# Patient Record
Sex: Male | Born: 1945 | Race: White | Hispanic: No | Marital: Married | Smoking: Former smoker
Health system: Southern US, Community
[De-identification: ages and names within clinical notes are randomized; demographics above are authoritative.]

## PROBLEM LIST (undated history)

## (undated) DIAGNOSIS — I10 Essential (primary) hypertension: Secondary | ICD-10-CM

## (undated) DIAGNOSIS — A809 Acute poliomyelitis, unspecified: Secondary | ICD-10-CM

## (undated) HISTORY — DX: Essential (primary) hypertension: I10

## (undated) HISTORY — DX: Acute poliomyelitis, unspecified: A80.9

## (undated) HISTORY — PX: FEMUR SURGERY: SHX943

## (undated) HISTORY — PX: BONY PELVIS SURGERY: SHX572

## (undated) HISTORY — PX: KNEE SURGERY: SHX244

---

## 2007-10-11 ENCOUNTER — Encounter: Admission: RE | Admit: 2007-10-11 | Discharge: 2007-10-11 | Payer: Self-pay | Admitting: Orthopedic Surgery

## 2009-07-08 ENCOUNTER — Ambulatory Visit: Payer: Self-pay | Admitting: Gastroenterology

## 2009-07-22 ENCOUNTER — Ambulatory Visit: Payer: Self-pay | Admitting: Gastroenterology

## 2010-10-04 ENCOUNTER — Encounter: Payer: Self-pay | Admitting: Orthopedic Surgery

## 2014-05-21 ENCOUNTER — Other Ambulatory Visit: Payer: Self-pay | Admitting: *Deleted

## 2014-05-21 ENCOUNTER — Ambulatory Visit (INDEPENDENT_AMBULATORY_CARE_PROVIDER_SITE_OTHER): Payer: Medicare PPO | Admitting: Podiatry

## 2014-05-21 ENCOUNTER — Ambulatory Visit (INDEPENDENT_AMBULATORY_CARE_PROVIDER_SITE_OTHER): Payer: Medicare PPO

## 2014-05-21 ENCOUNTER — Encounter: Payer: Self-pay | Admitting: Podiatry

## 2014-05-21 VITALS — Ht 67.0 in | Wt 170.0 lb

## 2014-05-21 DIAGNOSIS — M2041 Other hammer toe(s) (acquired), right foot: Secondary | ICD-10-CM

## 2014-05-21 DIAGNOSIS — L608 Other nail disorders: Secondary | ICD-10-CM

## 2014-05-21 DIAGNOSIS — M2040 Other hammer toe(s) (acquired), unspecified foot: Secondary | ICD-10-CM

## 2014-05-21 DIAGNOSIS — B351 Tinea unguium: Secondary | ICD-10-CM

## 2014-05-21 DIAGNOSIS — Q828 Other specified congenital malformations of skin: Secondary | ICD-10-CM

## 2014-05-21 DIAGNOSIS — M21611 Bunion of right foot: Secondary | ICD-10-CM

## 2014-05-21 DIAGNOSIS — M21619 Bunion of unspecified foot: Secondary | ICD-10-CM

## 2014-05-21 NOTE — Progress Notes (Signed)
   Subjective:    Patient ID: Edward Frazier, male    DOB: 05-18-1946, 68 y.o.   MRN: [id number]  HPI Comments: The right foot the middle toe seem to get sore and than it goes away, it has been numerous years of this going on . It is to the point i need to do something. Been walking on the tips of my toes since mid twenties early 30's . 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   Foot Pain      Review of Systems  All other systems reviewed and are negative.      Objective:   Physical Exam: I have reviewed his past medical history medications allergies surgeries social history and review of systems. Pulses are strongly palpable bilateral. Neurologic sensorium is not intact to the right foot secondary to history of polio. Deep tendon reflexes are hyperreflexive. Orthopedic evaluation demonstrates limited range of motion and muscle strength right. Plantar flexed and coronal hammertoe deformities #3 #4 #5 of the right foot resulting in a distal clavus to the third digit of the right foot. Radiographs do not demonstrate any type of osseous abnormalities in this area other than a hammertoe deformities. He does have a red erythematous rash the plantar aspect of the foot appears to be a dyshidrotic eczema or possibly tinea pedis he also has thick yellow dystrophic possibly mycotic nails to the hallux and second digit of the right foot. Distal clavus third digit right foot.        Assessment & Plan:  Assessment: Polio patient plantarflex hammertoe deformity is resulting in distal clavus third digit right foot. Dystrophic nails and possible tinea pedis I lateral.  Plan: Discussed etiology pathology conservative versus surgical therapies. He plans on having foot surgery performed by an orthopedist going to take a look the skin. I took a sample of the nails today and debridement reactive hyperkeratosis I will followup with him with his biopsy  returns.

## 2014-06-04 ENCOUNTER — Telehealth: Payer: Self-pay | Admitting: *Deleted

## 2014-06-04 MED ORDER — NUVAIL EX SOLN: 1.0000 "application " | Freq: Every day | CUTANEOUS | Status: DC

## 2014-06-04 NOTE — Telephone Encounter (Signed)
I called and informed the patient that Dr. Al Corpus said the culture was negative for fungus.  It's just a deformity of the nail, he said he can prescribe Nuvail to help thin the nail.  He asked how he could proceed.  I told him I would send the prescription to his pharmacy.  He asked that I send it to [facility].

## 2014-06-07 ENCOUNTER — Encounter: Payer: Self-pay | Admitting: Podiatry

## 2014-08-26 DIAGNOSIS — M21969 Unspecified acquired deformity of unspecified lower leg: Secondary | ICD-10-CM | POA: Insufficient documentation

## 2015-04-02 DIAGNOSIS — C44629 Squamous cell carcinoma of skin of left upper limb, including shoulder: Secondary | ICD-10-CM | POA: Diagnosis not present

## 2015-04-02 DIAGNOSIS — Z85828 Personal history of other malignant neoplasm of skin: Secondary | ICD-10-CM | POA: Diagnosis not present

## 2015-04-02 DIAGNOSIS — D485 Neoplasm of uncertain behavior of skin: Secondary | ICD-10-CM | POA: Diagnosis not present

## 2015-04-02 DIAGNOSIS — L570 Actinic keratosis: Secondary | ICD-10-CM | POA: Diagnosis not present

## 2015-04-02 DIAGNOSIS — C44529 Squamous cell carcinoma of skin of other part of trunk: Secondary | ICD-10-CM | POA: Diagnosis not present

## 2015-04-08 ENCOUNTER — Encounter: Payer: Self-pay | Admitting: Gastroenterology

## 2016-10-01 DIAGNOSIS — L719 Rosacea, unspecified: Secondary | ICD-10-CM | POA: Insufficient documentation

## 2017-10-06 DIAGNOSIS — G8190 Hemiplegia, unspecified affecting unspecified side: Secondary | ICD-10-CM | POA: Insufficient documentation

## 2018-03-30 ENCOUNTER — Encounter: Payer: Self-pay | Admitting: Podiatry

## 2018-03-30 ENCOUNTER — Ambulatory Visit: Payer: Medicare Other

## 2018-03-30 ENCOUNTER — Ambulatory Visit (INDEPENDENT_AMBULATORY_CARE_PROVIDER_SITE_OTHER): Payer: Medicare Other | Admitting: Podiatry

## 2018-03-30 VITALS — BP 132/72 | HR 79

## 2018-03-30 DIAGNOSIS — B351 Tinea unguium: Secondary | ICD-10-CM

## 2018-03-30 DIAGNOSIS — M2042 Other hammer toe(s) (acquired), left foot: Principal | ICD-10-CM

## 2018-03-30 DIAGNOSIS — G5761 Lesion of plantar nerve, right lower limb: Secondary | ICD-10-CM | POA: Diagnosis not present

## 2018-03-30 DIAGNOSIS — M2041 Other hammer toe(s) (acquired), right foot: Secondary | ICD-10-CM

## 2018-03-30 DIAGNOSIS — M79676 Pain in unspecified toe(s): Secondary | ICD-10-CM | POA: Diagnosis not present

## 2018-03-30 DIAGNOSIS — G5791 Unspecified mononeuropathy of right lower limb: Secondary | ICD-10-CM

## 2018-04-01 NOTE — Progress Notes (Signed)
He presents today with a history of polio resulting in severe hammertoe deformities where he is walking on the dorsal aspect of his toes of the right foot.  They are curled underneath and he is walking on them and his toenails are painful he says.  He states that they are very very tender and cannot be cut without anesthesia.  Objective: Vital signs are stable he is alert and oriented x3 I have reviewed his past medical history medications allergy surgeries social history pulses remain palpable.  Not a lot of strength to the right foot and hammertoe deformities with severe contracture deformities of the lesser digits resulting in him walking on the dorsal aspect of the toes plantarly.  Nails are thick yellow dystrophic-like mycotic painful palpation in the whole forefoot is exquisitely painful.  Assessment: Pain in limb secondary to onychomycosis and neuritis.  Plan: Debridement of toenails bilaterally.  And also performed a nerve block in order to debride his nails on his right foot.

## 2018-07-24 DIAGNOSIS — M6701 Short Achilles tendon (acquired), right ankle: Secondary | ICD-10-CM | POA: Insufficient documentation

## 2018-07-24 DIAGNOSIS — M2040 Other hammer toe(s) (acquired), unspecified foot: Secondary | ICD-10-CM | POA: Insufficient documentation

## 2019-06-27 ENCOUNTER — Encounter: Payer: Self-pay | Admitting: Gastroenterology

## 2019-10-03 ENCOUNTER — Ambulatory Visit: Payer: Medicare PPO | Attending: Internal Medicine

## 2019-10-03 DIAGNOSIS — Z23 Encounter for immunization: Secondary | ICD-10-CM | POA: Insufficient documentation

## 2019-10-03 NOTE — Progress Notes (Signed)
   Covid-19 Vaccination Clinic  Name:  Edward Frazier    MRN: [id number] DOB: 13-Mar-1946  10/03/2019  [name] was observed post Covid-19 immunization for 15 minutes without incidence. He was provided with Vaccine Information Sheet and instruction to access the V-Safe system.   [name] was instructed to call 911 with any severe reactions post vaccine: Marland Kitchen Difficulty breathing  . Swelling of your face and throat  . A fast heartbeat  . A bad rash all over your body  . Dizziness and weakness    Immunizations Administered    Name Date Dose VIS Date Route   Pfizer COVID-19 Vaccine 10/03/2019  4:28 PM 0.3 mL 08/24/2019 Intramuscular   Manufacturer: [employer], [employer]   Lot: [id number]   NDC: [address]-8

## 2019-10-24 ENCOUNTER — Ambulatory Visit: Payer: Medicare PPO | Attending: Internal Medicine

## 2019-10-24 DIAGNOSIS — Z23 Encounter for immunization: Secondary | ICD-10-CM | POA: Insufficient documentation

## 2019-10-24 NOTE — Progress Notes (Signed)
   Covid-19 Vaccination Clinic  Name:  Edward Frazier    MRN: [id number] DOB: May 21, 1946  10/24/2019  [name] was observed post Covid-19 immunization for 15 minutes without incidence. He was provided with Vaccine Information Sheet and instruction to access the V-Safe system.   [name] was instructed to call 911 with any severe reactions post vaccine: Marland Kitchen Difficulty breathing  . Swelling of your face and throat  . A fast heartbeat  . A bad rash all over your body  . Dizziness and weakness    Immunizations Administered    Name Date Dose VIS Date Route   Pfizer COVID-19 Vaccine 10/24/2019  9:27 AM 0.3 mL 08/24/2019 Intramuscular   Manufacturer: [employer], [employer]   Lot: [id number]   NDC: [address]-3

## 2019-11-02 ENCOUNTER — Other Ambulatory Visit: Payer: Self-pay

## 2019-11-02 ENCOUNTER — Ambulatory Visit: Payer: Medicare PPO | Admitting: Podiatry

## 2019-11-02 ENCOUNTER — Encounter: Payer: Self-pay | Admitting: *Deleted

## 2019-11-02 ENCOUNTER — Encounter: Payer: Self-pay | Admitting: Podiatry

## 2019-11-02 DIAGNOSIS — B351 Tinea unguium: Secondary | ICD-10-CM

## 2019-11-02 DIAGNOSIS — L84 Corns and callosities: Secondary | ICD-10-CM | POA: Diagnosis not present

## 2019-11-02 DIAGNOSIS — Q740 Other congenital malformations of upper limb(s), including shoulder girdle: Secondary | ICD-10-CM | POA: Insufficient documentation

## 2019-11-02 DIAGNOSIS — M79676 Pain in unspecified toe(s): Secondary | ICD-10-CM | POA: Diagnosis not present

## 2019-11-02 NOTE — Progress Notes (Signed)
This patient presents the office with chief complaint of painful third toe right foot.  He says that his toe is painful walking and wearing his shoes.  He says he was previously seen in 2019 by Dr. Al Corpus who provided him with anesthesia prior to nail treatment.  This patient has a history of severe contracted digital deformities on his right foot due to polio.  He presents the office today for an evaluation and treatment of his painful third toe right foot.  He also has  long thick painful nails which he says his wife occasionally trims.  Patient states he is not interested in toe surgery on his right foot but request preventative foot care services.   General Appearance  Alert, conversant and in no acute stress.  Vascular  Dorsalis pedis and posterior tibial  pulses are palpable  bilaterally.  Capillary return is within normal limits  bilaterally. Temperature is within normal limits  bilaterally.  Neurologic  Senn-Weinstein monofilament wire test within normal limits  bilaterally. Muscle power within normal limits left.  Muscle power diminished right foot.  Nails Thick disfigured discolored nails with subungual debris  from hallux to fifth toes bilaterally. No evidence of bacterial infection or drainage bilaterally.  Orthopedic  No limitations of motion  feet .  No crepitus or effusions noted.  Severe digital contractures especially his third toe right foot.  Skin  normotropic skin with no porokeratosis noted bilaterally.  No signs of infections or ulcers noted.  Digital clavi 3rd toe right foot.  Onychomycosis  Clavi 3rd toe right foot.  ROV.  Debride nails  B/L.  Debride clavi 3rd toe under local anesthesia third toe right foot.  RTC 3 months.  Helane Gunther DPM

## 2019-11-07 ENCOUNTER — Encounter: Payer: Self-pay | Admitting: Cardiology

## 2019-11-21 ENCOUNTER — Other Ambulatory Visit: Payer: Self-pay | Admitting: Internal Medicine

## 2019-11-21 DIAGNOSIS — I10 Essential (primary) hypertension: Secondary | ICD-10-CM

## 2019-12-03 ENCOUNTER — Ambulatory Visit
Admission: RE | Admit: 2019-12-03 | Discharge: 2019-12-03 | Disposition: A | Discharge status: Home / Self Care | Payer: Medicare PPO | Source: Ambulatory Visit | Attending: Internal Medicine | Admitting: Internal Medicine

## 2019-12-03 DIAGNOSIS — I10 Essential (primary) hypertension: Secondary | ICD-10-CM

## 2019-12-06 ENCOUNTER — Telehealth: Payer: Self-pay | Admitting: Cardiology

## 2019-12-06 NOTE — Telephone Encounter (Signed)
New Message  Patient is wanting to get his wife approved to accompany him to his appointment on 12/26/19 with Dr. Antoine Poche. Patient states that he needs his wife Edward Frazier) there with him to hear what Dr. Antoine Poche has to say. Please give patient a call back to confirm.

## 2019-12-06 NOTE — Telephone Encounter (Signed)
Informed pt that per Dr. Jenene Slicker nurse, wife ok to attend appointment.

## 2019-12-24 DIAGNOSIS — Z7189 Other specified counseling: Secondary | ICD-10-CM | POA: Insufficient documentation

## 2019-12-24 DIAGNOSIS — I2510 Atherosclerotic heart disease of native coronary artery without angina pectoris: Secondary | ICD-10-CM | POA: Insufficient documentation

## 2019-12-24 NOTE — Progress Notes (Signed)
Cardiology Office Note   Date:  12/26/2019   ID:  Edward Frazier, Edward Frazier 12/31/1945, MRN [id number]  PCP:  Martha Clan, MD  Cardiologist:   No primary care provider on file. Referring:  Martha Clan, MD  No chief complaint on file.     History of Present Illness: Edward Frazier is a 74 y.o. male who presents for evaluation of elevated coronary calcium.  He has no history of coronary disease but did have some mild dyslipidemia.  He was sent for a coronary calcium score.  This came back elevated with a count of 874 in the left main, LAD 305 in the left circumflex 79.  Total was 1258 which put him at the 83rd percentile for his age.  He walks with a limp because of previous polio but he is active.  He denies any significant limitations though he has not been walking recently particularly because of this finding.  He has not had any chest pressure, neck or arm discomfort.  He denies any shortness of breath, PND or orthopnea.  He had no weight gain or edema.  He has never had any prior cardiac testing.  Past Medical History:  Diagnosis Date  . Hypertension   . Polio     No past surgical history on file.   Current Outpatient Medications  Medication Sig Dispense Refill  . amLODipine (NORVASC) 5 MG tablet     . aspirin 81 MG tablet Take 81 mg by mouth daily.    Marland Kitchen doxycycline (VIBRAMYCIN) 100 MG capsule     . lisinopril (PRINIVIL,ZESTRIL) 20 MG tablet     . rosuvastatin (CRESTOR) 20 MG tablet      No current facility-administered medications for this visit.    Allergies:   Penicillin g and Penicillins    Social History:  The patient  reports that he has quit smoking. He has never used smokeless tobacco.   Family History:  The patient's family history includes Cancer in his mother; Heart attack in his father.    ROS:  Please see the history of present illness.   Otherwise, review of systems are positive for none.   All other systems are reviewed and negative.    PHYSICAL  EXAM: VS:  BP 128/66   Pulse 72   Temp (!) 97.1 F (36.2 C)   Ht 5\' 7"  (1.702 m)   Wt 171 lb 6.4 oz (77.7 kg)   SpO2 98%   BMI 26.85 kg/m  , BMI Body mass index is 26.85 kg/m. GENERAL:  Well appearing HEENT:  Pupils equal round and reactive, fundi not visualized, oral mucosa unremarkable NECK:  No jugular venous distention, waveform within normal limits, carotid upstroke brisk and symmetric, no bruits, no thyromegaly LYMPHATICS:  No cervical, inguinal adenopathy LUNGS:  Clear to auscultation bilaterally BACK:  No CVA tenderness CHEST:  Unremarkable HEART:  PMI not displaced or sustained,S1 and S2 within normal limits, no S3, no S4, no clicks, no rubs, no murmurs ABD:  Flat, positive bowel sounds normal in frequency in pitch, no bruits, no rebound, no guarding, no midline pulsatile mass, no hepatomegaly, no splenomegaly EXT:  2 plus pulses throughout, no edema, no cyanosis no clubbing SKIN:  No rashes no nodules NEURO:  Cranial nerves II through XII grossly intact, motor grossly intact throughout PSYCH:  Cognitively intact, oriented to person place and time    EKG:  EKG is ordered today. The ekg ordered today demonstrates sinus rhythm, rate 74, axis within normal  limits, intervals within normal limits, no acute ST-T wave changes.   Recent Labs: No results found for requested labs within last 8760 hours.    Lipid Panel No results found for: CHOL, TRIG, HDL, CHOLHDL, VLDL, LDLCALC, LDLDIRECT    Wt Readings from Last 3 Encounters:  12/26/19 171 lb 6.4 oz (77.7 kg)  05/21/14 170 lb (77.1 kg)      Other studies Reviewed: Additional studies/ records that were reviewed today include: Office records and labs from Marton Redwood, MD. Review of the above records demonstrates:  Please see elsewhere in the note.     ASSESSMENT AND PLAN:  CAD:   The patient does have coronary calcium.  He does not have symptoms of obstructive disease. I will bring the patient back for a POET  (Plain Old Exercise Test). This will allow me to screen for obstructive coronary disease, risk stratify and very importantly provide a prescription for exercise.    COVID EDUCATION: He has had his vaccine.  DYSLIPIDEMIA: I agree given his elevated calcium score that statin is appropriate and he has been started on this.  The goal will be an LDL less than 70.  His LDL was 107.  Current medicines are reviewed at length with the patient today.  The patient does not have concerns regarding medicines.  The following changes have been made:  no change  Labs/ tests ordered today include:   Orders Placed This Encounter  Procedures  . EXERCISE TOLERANCE TEST (ETT)  . EKG 12-Lead     Disposition:   FU with me in one year or sooner based on test results.     Signed, Minus Breeding, MD  12/26/2019 6:12 PM    [facility]

## 2019-12-26 ENCOUNTER — Other Ambulatory Visit: Payer: Self-pay

## 2019-12-26 ENCOUNTER — Ambulatory Visit: Payer: Medicare PPO | Admitting: Cardiology

## 2019-12-26 ENCOUNTER — Encounter: Payer: Self-pay | Admitting: Cardiology

## 2019-12-26 VITALS — BP 128/66 | HR 72 | Temp 97.1°F | Ht 67.0 in | Wt 171.4 lb

## 2019-12-26 DIAGNOSIS — Z7189 Other specified counseling: Secondary | ICD-10-CM

## 2019-12-26 DIAGNOSIS — I2510 Atherosclerotic heart disease of native coronary artery without angina pectoris: Secondary | ICD-10-CM | POA: Diagnosis not present

## 2019-12-26 DIAGNOSIS — R072 Precordial pain: Secondary | ICD-10-CM

## 2019-12-26 DIAGNOSIS — R002 Palpitations: Secondary | ICD-10-CM

## 2019-12-26 DIAGNOSIS — R0602 Shortness of breath: Secondary | ICD-10-CM | POA: Diagnosis not present

## 2019-12-26 NOTE — Patient Instructions (Signed)
Medication Instructions:  No changes *If you need a refill on your cardiac medications before your next appointment, please call your pharmacy*  Lab Work: None ordered this visit  Testing/Procedures:  You will need a covid screen 3 days before your stress test.This is a Drive Up Visit at the [facility] [address], [place]. Someone will direct you to the appropriate testing line. Stay in your car and someone will be with you shortly  Your physician has requested that you have an exercise tolerance test. For further information please visit [email or web address] Please also follow instruction sheet, as given.   Follow-Up: At [facility], you and your health needs are our priority.  As part of our continuing mission to provide you with exceptional heart care, we have created designated Provider Care Teams.  These Care Teams include your primary Cardiologist (physician) and Advanced Practice Providers (APPs -  Physician Assistants and Nurse Practitioners) who all work together to provide you with the care you need, when you need it.  We recommend signing up for the patient portal called "MyChart".  Sign up information is provided on this After Visit Summary.  MyChart is used to connect with patients for Virtual Visits (Telemedicine).  Patients are able to view lab/test results, encounter notes, upcoming appointments, etc.  Non-urgent messages can be sent to your provider as well.   To learn more about what you can do with MyChart, go to [email or web address].    Your next appointment:   12 month(s)  You will receive a reminder letter in the mail two months in advance. If you Jameek't receive a letter, please call our office to schedule the follow-up appointment.  The format for your next appointment:   In Person  Provider:   Rollene Rotunda, MD

## 2020-01-07 ENCOUNTER — Other Ambulatory Visit (HOSPITAL_COMMUNITY)
Admission: RE | Admit: 2020-01-07 | Discharge: 2020-01-07 | Disposition: A | Discharge status: Home / Self Care | Payer: Medicare PPO | Source: Ambulatory Visit | Attending: Cardiology | Admitting: Cardiology

## 2020-01-07 DIAGNOSIS — Z20822 Contact with and (suspected) exposure to covid-19: Secondary | ICD-10-CM | POA: Insufficient documentation

## 2020-01-07 DIAGNOSIS — Z01812 Encounter for preprocedural laboratory examination: Secondary | ICD-10-CM | POA: Diagnosis not present

## 2020-01-07 LAB — SARS CORONAVIRUS 2 (TAT 6-24 HRS): SARS Coronavirus 2: NEGATIVE

## 2020-01-08 ENCOUNTER — Telehealth (HOSPITAL_COMMUNITY): Payer: Self-pay

## 2020-01-08 NOTE — Telephone Encounter (Signed)
Encounter complete. 

## 2020-01-10 ENCOUNTER — Other Ambulatory Visit: Payer: Self-pay

## 2020-01-10 ENCOUNTER — Ambulatory Visit (HOSPITAL_COMMUNITY)
Admission: RE | Admit: 2020-01-10 | Discharge: 2020-01-10 | Disposition: A | Discharge status: Home / Self Care | Payer: Medicare PPO | Source: Ambulatory Visit | Attending: Cardiology | Admitting: Cardiology

## 2020-01-10 DIAGNOSIS — R072 Precordial pain: Secondary | ICD-10-CM | POA: Diagnosis not present

## 2020-01-10 DIAGNOSIS — R0602 Shortness of breath: Secondary | ICD-10-CM

## 2020-01-10 DIAGNOSIS — I2510 Atherosclerotic heart disease of native coronary artery without angina pectoris: Secondary | ICD-10-CM | POA: Diagnosis not present

## 2020-01-10 DIAGNOSIS — R002 Palpitations: Secondary | ICD-10-CM | POA: Diagnosis not present

## 2020-01-11 LAB — EXERCISE TOLERANCE TEST
Estimated workload: 5.8 METS
Exercise duration (min): 4 min
Exercise duration (sec): 0 s
MPHR: 146 {beats}/min
Peak HR: 142 {beats}/min
Percent HR: 97 %
Rest HR: 76 {beats}/min

## 2020-01-14 ENCOUNTER — Telehealth: Payer: Self-pay | Admitting: Cardiology

## 2020-01-14 NOTE — Telephone Encounter (Signed)
Results reviewed

## 2020-01-14 NOTE — Telephone Encounter (Signed)
Follow Up:    Returning your call, concerning his test results.

## 2020-04-16 DIAGNOSIS — L570 Actinic keratosis: Secondary | ICD-10-CM | POA: Diagnosis not present

## 2020-04-16 DIAGNOSIS — D692 Other nonthrombocytopenic purpura: Secondary | ICD-10-CM | POA: Diagnosis not present

## 2020-04-16 DIAGNOSIS — L821 Other seborrheic keratosis: Secondary | ICD-10-CM | POA: Diagnosis not present

## 2020-04-16 DIAGNOSIS — Z85828 Personal history of other malignant neoplasm of skin: Secondary | ICD-10-CM | POA: Diagnosis not present

## 2020-11-05 DIAGNOSIS — R7301 Impaired fasting glucose: Secondary | ICD-10-CM | POA: Diagnosis not present

## 2020-11-05 DIAGNOSIS — Z125 Encounter for screening for malignant neoplasm of prostate: Secondary | ICD-10-CM | POA: Diagnosis not present

## 2020-11-05 DIAGNOSIS — E785 Hyperlipidemia, unspecified: Secondary | ICD-10-CM | POA: Diagnosis not present

## 2020-11-12 DIAGNOSIS — R82998 Other abnormal findings in urine: Secondary | ICD-10-CM | POA: Diagnosis not present

## 2020-11-12 DIAGNOSIS — L719 Rosacea, unspecified: Secondary | ICD-10-CM | POA: Diagnosis not present

## 2020-11-12 DIAGNOSIS — Z8612 Personal history of poliomyelitis: Secondary | ICD-10-CM | POA: Diagnosis not present

## 2020-11-12 DIAGNOSIS — R7301 Impaired fasting glucose: Secondary | ICD-10-CM | POA: Diagnosis not present

## 2020-11-12 DIAGNOSIS — M21969 Unspecified acquired deformity of unspecified lower leg: Secondary | ICD-10-CM | POA: Diagnosis not present

## 2020-11-12 DIAGNOSIS — I2510 Atherosclerotic heart disease of native coronary artery without angina pectoris: Secondary | ICD-10-CM | POA: Diagnosis not present

## 2020-11-12 DIAGNOSIS — Z1331 Encounter for screening for depression: Secondary | ICD-10-CM | POA: Diagnosis not present

## 2020-11-12 DIAGNOSIS — Z1339 Encounter for screening examination for other mental health and behavioral disorders: Secondary | ICD-10-CM | POA: Diagnosis not present

## 2020-11-12 DIAGNOSIS — Z0000 Encounter for general adult medical examination without abnormal findings: Secondary | ICD-10-CM | POA: Diagnosis not present

## 2020-11-12 DIAGNOSIS — I10 Essential (primary) hypertension: Secondary | ICD-10-CM | POA: Diagnosis not present

## 2020-11-12 DIAGNOSIS — G8190 Hemiplegia, unspecified affecting unspecified side: Secondary | ICD-10-CM | POA: Diagnosis not present

## 2020-11-12 DIAGNOSIS — E785 Hyperlipidemia, unspecified: Secondary | ICD-10-CM | POA: Diagnosis not present

## 2020-11-19 DIAGNOSIS — L821 Other seborrheic keratosis: Secondary | ICD-10-CM | POA: Diagnosis not present

## 2020-11-19 DIAGNOSIS — Z85828 Personal history of other malignant neoplasm of skin: Secondary | ICD-10-CM | POA: Diagnosis not present

## 2020-11-19 DIAGNOSIS — L218 Other seborrheic dermatitis: Secondary | ICD-10-CM | POA: Diagnosis not present

## 2020-11-19 DIAGNOSIS — L570 Actinic keratosis: Secondary | ICD-10-CM | POA: Diagnosis not present

## 2020-11-19 DIAGNOSIS — L814 Other melanin hyperpigmentation: Secondary | ICD-10-CM | POA: Diagnosis not present

## 2021-01-05 NOTE — Progress Notes (Signed)
Cardiology Office Note   Date:  01/07/2021   ID:  Edward Frazier, Edward Frazier 07-Aug-1946, MRN [id number]  PCP:  Cleatis Polka., MD  Cardiologist:   No primary care provider on file. Referring:  Cleatis Polka., MD  Chief Complaint  Patient presents with  . Elevated Coronary Calcium      History of Present Illness: Edward Frazier is a 75 y.o. male who presents for evaluation of elevated coronary calcium.  He has no history of coronary disease but did have some mild dyslipidemia.  He was sent for a coronary calcium score.  This came back elevated with a count of 874 in the left main, LAD 305 in the left circumflex 79.  Total was 1258 which put him at the 83rd percentile for his age.  He had a negative POET (Plain Old Exercise Treadmill) last year.    He walks with a limp because of previous polio.  He remains active.  The patient denies any new symptoms such as chest discomfort, neck or arm discomfort. There has been no new shortness of breath, PND or orthopnea. There have been no reported palpitations, presyncope or syncope.    Past Medical History:  Diagnosis Date  . Hypertension   . Polio     Past Surgical History:  Procedure Laterality Date  . BONY PELVIS SURGERY    . FEMUR SURGERY    . KNEE SURGERY       Current Outpatient Medications  Medication Sig Dispense Refill  . amLODipine (NORVASC) 5 MG tablet     . doxycycline (VIBRAMYCIN) 100 MG capsule     . lisinopril (PRINIVIL,ZESTRIL) 20 MG tablet     . rosuvastatin (CRESTOR) 20 MG tablet      No current facility-administered medications for this visit.    Allergies:   Penicillin g and Penicillins    ROS:  Please see the history of present illness.   Otherwise, review of systems are positive for none.   All other systems are reviewed and negative.    PHYSICAL EXAM: VS:  BP 124/66   Pulse 65   Ht 5\' 7"  (1.702 m)   Wt 166 lb 3.2 oz (75.4 kg)   SpO2 98%   BMI 26.03 kg/m  , BMI Body mass index is 26.03  kg/m. GENERAL:  Well appearing NECK:  No jugular venous distention, waveform within normal limits, carotid upstroke brisk and symmetric, no bruits, no thyromegaly LUNGS:  Clear to auscultation bilaterally CHEST:  Unremarkable HEART:  PMI not displaced or sustained,S1 and S2 within normal limits, no S3, no S4, no clicks, no rubs, no murmurs ABD:  Flat, positive bowel sounds normal in frequency in pitch, no bruits, no rebound, no guarding, no midline pulsatile mass, no hepatomegaly, no splenomegaly EXT:  2 plus pulses throughout, no edema, no cyanosis no clubbing  EKG:  EKG is  ordered today. The ekg ordered today demonstrates sinus rhythm, rate 65, axis within normal limits, intervals within normal limits, no acute ST-T wave changes.   Recent Labs: No results found for requested labs within last 8760 hours.    Lipid Panel No results found for: CHOL, TRIG, HDL, CHOLHDL, VLDL, LDLCALC, LDLDIRECT    Wt Readings from Last 3 Encounters:  01/06/21 166 lb 3.2 oz (75.4 kg)  12/26/19 171 lb 6.4 oz (77.7 kg)  05/21/14 170 lb (77.1 kg)      Other studies Reviewed: Additional studies/ records that were reviewed today include:  Labs Review of the above records demonstrates:  Please see elsewhere in the note.     ASSESSMENT AND PLAN:  CAD:    The patient has no new sypmtoms.  No further cardiovascular testing is indicated.  We will continue with aggressive risk reduction and meds as listed.  I had a long discussion with him about the risk benefits of aspirin.  I think since he has an elevated Framingham risk score he should continue on aspirin.    I will order a .POET (Plain Old Exercise Treadmill) prior to his next visit.  DYSLIPIDEMIA:  LDL was 40 with an HDL of 45.  I would continue him on the meds as listed.  Current medicines are reviewed at length with the patient today.  The patient does not have concerns regarding medicines.  The following changes have been made:   None  Labs/  tests ordered today include: None  Orders Placed This Encounter  Procedures  . EKG 12-Lead     Disposition:   FU with me in 12 months.     Signed, Rollene Rotunda, MD  01/07/2021 9:19 AM    [facility]

## 2021-01-06 ENCOUNTER — Other Ambulatory Visit: Payer: Self-pay

## 2021-01-06 ENCOUNTER — Encounter: Payer: Self-pay | Admitting: Cardiology

## 2021-01-06 ENCOUNTER — Ambulatory Visit: Payer: Medicare PPO | Admitting: Cardiology

## 2021-01-06 VITALS — BP 124/66 | HR 65 | Ht 67.0 in | Wt 166.2 lb

## 2021-01-06 DIAGNOSIS — I2510 Atherosclerotic heart disease of native coronary artery without angina pectoris: Secondary | ICD-10-CM | POA: Diagnosis not present

## 2021-01-06 NOTE — Patient Instructions (Signed)
Medication Instructions:  Your physician recommends that you continue on your current medications as directed. Please refer to the Current Medication list given to you today.  *If you need a refill on your cardiac medications before your next appointment, please call your pharmacy*  Lab Work: NONE ordered at this time of appointment   If you have labs (blood work) drawn today and your tests are completely normal, you will receive your results only by: Marland Kitchen MyChart Message (if you have MyChart) OR . A paper copy in the mail If you have any lab test that is abnormal or we need to change your treatment, we will call you to review the results.  Testing/Procedures: Your physician has requested that you have an exercise tolerance test. For further information please visit [email or web address] Please also follow instruction sheet, as given.   Please schedule for 1 year    Follow-Up: At [facility], you and your health needs are our priority.  As part of our continuing mission to provide you with exceptional heart care, we have created designated Provider Care Teams.  These Care Teams include your primary Cardiologist (physician) and Advanced Practice Providers (APPs -  Physician Assistants and Nurse Practitioners) who all work together to provide you with the care you need, when you need it.  We recommend signing up for the patient portal called "MyChart".  Sign up information is provided on this After Visit Summary.  MyChart is used to connect with patients for Virtual Visits (Telemedicine).  Patients are able to view lab/test results, encounter notes, upcoming appointments, etc.  Non-urgent messages can be sent to your provider as well.   To learn more about what you can do with MyChart, go to [email or web address].    Your next appointment:   12 month(s)  The format for your next appointment:   In Person  Provider:   Rollene Rotunda, MD  Other Instructions

## 2021-01-07 ENCOUNTER — Encounter: Payer: Self-pay | Admitting: Cardiology

## 2021-03-11 ENCOUNTER — Ambulatory Visit: Payer: Medicare PPO | Admitting: Podiatry

## 2021-03-11 ENCOUNTER — Other Ambulatory Visit: Payer: Self-pay

## 2021-03-11 ENCOUNTER — Encounter: Payer: Self-pay | Admitting: Podiatry

## 2021-03-11 DIAGNOSIS — M79676 Pain in unspecified toe(s): Secondary | ICD-10-CM

## 2021-03-11 DIAGNOSIS — E785 Hyperlipidemia, unspecified: Secondary | ICD-10-CM | POA: Insufficient documentation

## 2021-03-11 DIAGNOSIS — Z0000 Encounter for general adult medical examination without abnormal findings: Secondary | ICD-10-CM | POA: Insufficient documentation

## 2021-03-11 DIAGNOSIS — B351 Tinea unguium: Secondary | ICD-10-CM | POA: Diagnosis not present

## 2021-03-11 DIAGNOSIS — L84 Corns and callosities: Secondary | ICD-10-CM

## 2021-03-11 NOTE — Progress Notes (Signed)
This patient returns to my office for at risk foot care.  This patient requires this care by a professional since this patient will be at risk due to having severely contracted digits right foot due to polio This patient is unable to cut nails himself since the patient cannot reach his nails.These nails are painful walking and wearing shoes.  This patient presents for at risk foot care today.  General Appearance  Alert, conversant and in no acute stress.  Vascular  Dorsalis pedis and posterior tibial  pulses are palpable  bilaterally.  Capillary return is within normal limits  bilaterally. Temperature is within normal limits  bilaterally.  Neurologic  Senn-Weinstein monofilament wire test within normal limits  bilaterally. Muscle power within normal limits bilaterally.  Nails Thick disfigured discolored nails with subungual debris  from hallux to fifth toes bilaterally. No evidence of bacterial infection or drainage bilaterally.  Orthopedic  No limitations of motion  feet .  No crepitus or effusions noted.  No bony pathology or digital deformities noted.  Skin  normotropic skin with no porokeratosis noted bilaterally.  No signs of infections or ulcers noted.  Asymptomatic clavi   Onychomycosis  Pain in right toes  Pain in left toes  Consent was obtained for treatment procedures.   Mechanical debridement of nails 1-5  bilaterally performed with a nail nipper.  Filed with dremel without incident. Anesthesia provided prior to treated third toenail right foot.  DSD applied.   Return office visit    prn                 Told patient to return for periodic foot care and evaluation due to potential at risk complications.   Helane Gunther DPM

## 2021-03-26 ENCOUNTER — Telehealth: Payer: Self-pay | Admitting: Gastroenterology

## 2021-03-26 NOTE — Telephone Encounter (Signed)
Inbound call from pt requesting a call back asking if he can do a cologuard instead on a colonoscopy. Please advise. Thanks.

## 2021-03-26 NOTE — Telephone Encounter (Signed)
Dr. Russella Dar, please see note below and advise. Thanks

## 2021-03-26 NOTE — Telephone Encounter (Signed)
Colonoscopy is the best test for colorectal cancer screening so that is our top recommendations. If he declines colonoscopy then Cologuard is a reasonable option. Cologuard can be ordered by [place] or by his PCP.

## 2021-03-27 NOTE — Telephone Encounter (Signed)
Lm on vm for patient to return call 

## 2021-03-27 NOTE — Telephone Encounter (Signed)
Spoke with patient in regards to information. Patient states that he will either reach out to [place] or his PCP once he makes a decision. He verbalized understanding and had no other concerns.

## 2021-04-26 IMAGING — CT CT CARDIAC CORONARY ARTERY CALCIUM SCORE
3 series · 13 of 20 positions shown, 15 images · non-contrast
Comparison: None.

CLINICAL DATA: 73-year-old white male with hypertension.

EXAM:
CT CARDIAC CORONARY ARTERY CALCIUM SCORE
TECHNIQUE: Non-contrast imaging through the heart was performed using
prospective ECG gating. Image post processing was performed on an
independent workstation, allowing for quantitative analysis of the
heart and coronary arteries. Note that this exam targets the heart
and the chest was not imaged in its entirety.

[Series 2: calcium scoring 2.00 qr36 bestdiast 71% hrt calciu · axial · 0.46mm/px · z∈[+1536,+1588]mm · 3 of 65 slices shown]
[im 13/65  vessel]
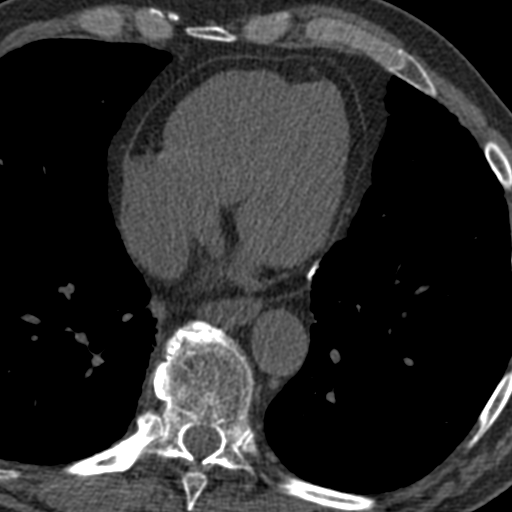
[im 26/65  vessel]
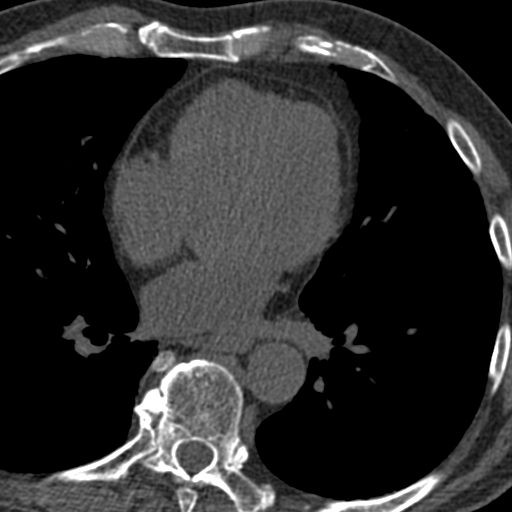
[im 39/65  vessel]
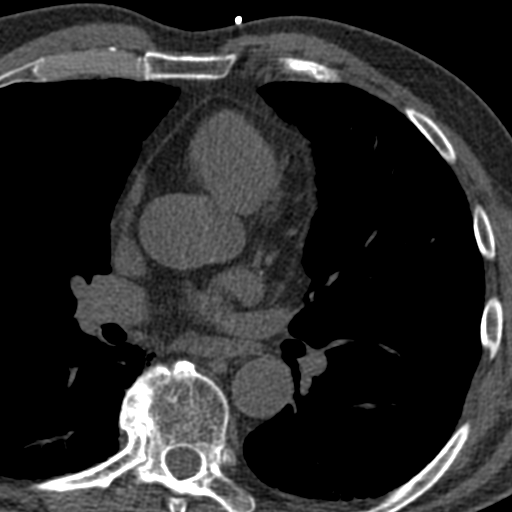

[Series 3: calcium scoring 2.00 br40 bestdiast 71% axial · axial · 0.58mm/px · z∈[+1532,+1618]mm · 5 of 65 slices shown, 7 images]
[im 11/65  vessel]
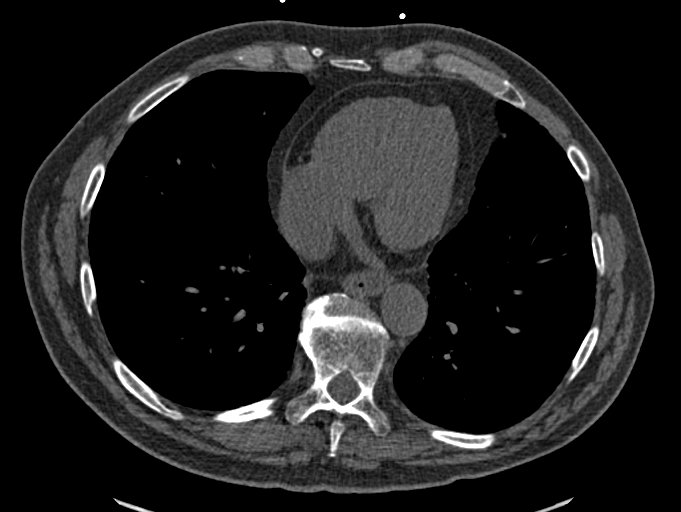
[im 11/65  lung]
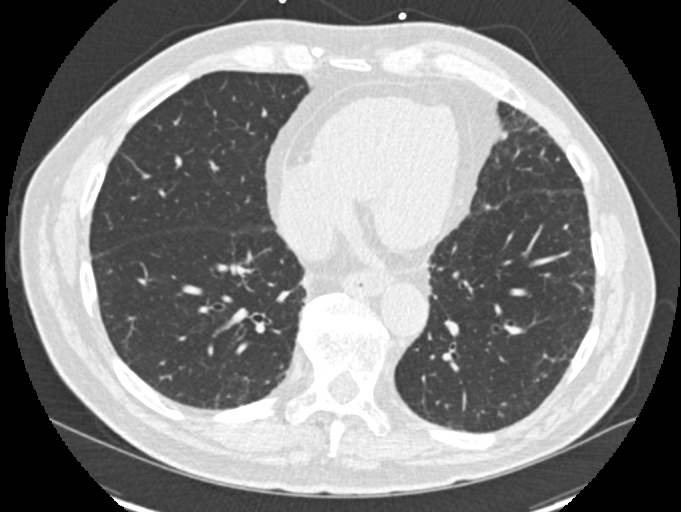
[im 22/65  vessel]
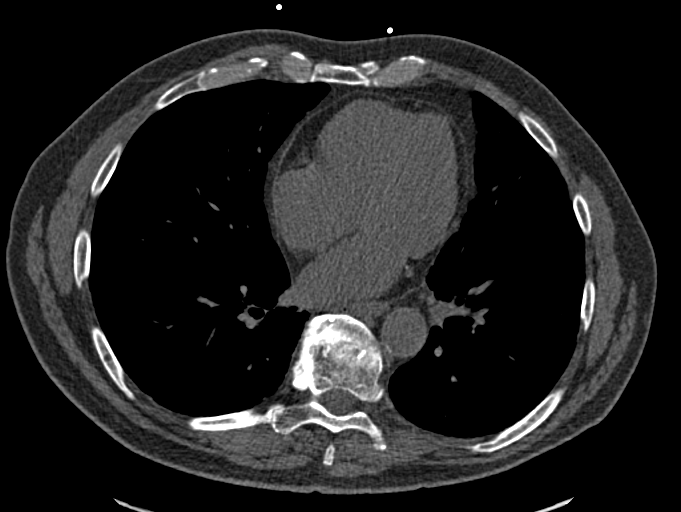
[im 33/65  vessel]
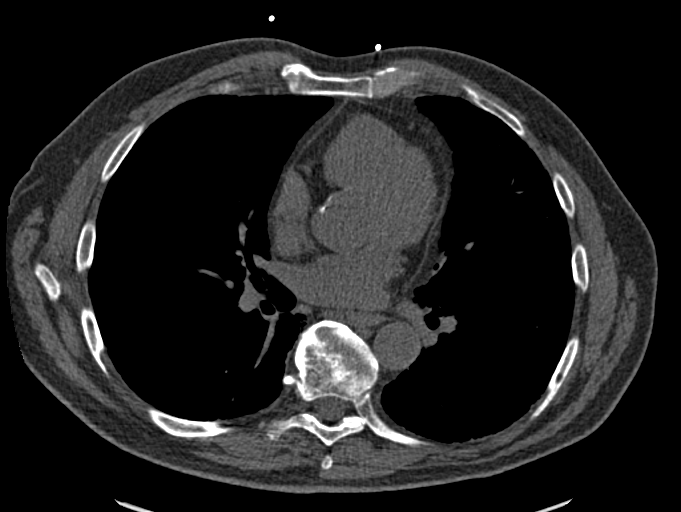
[im 43/65  vessel]
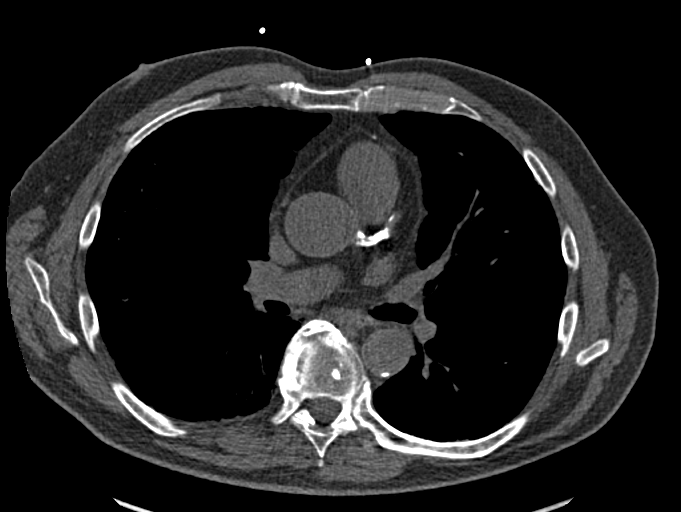
[im 54/65  vessel]
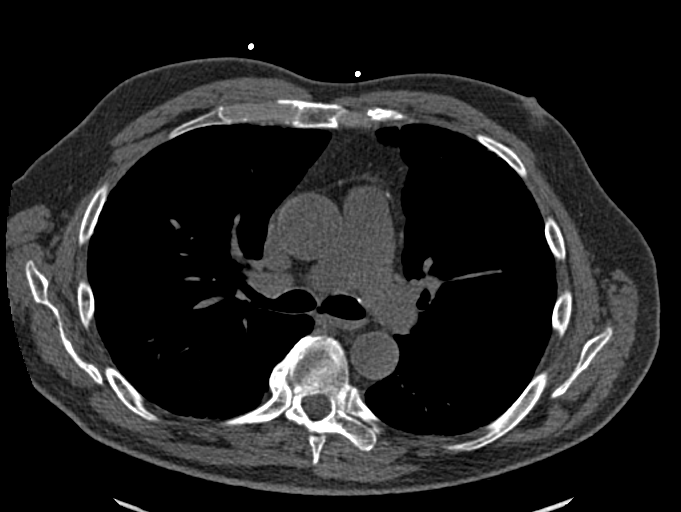
[im 54/65  lung]
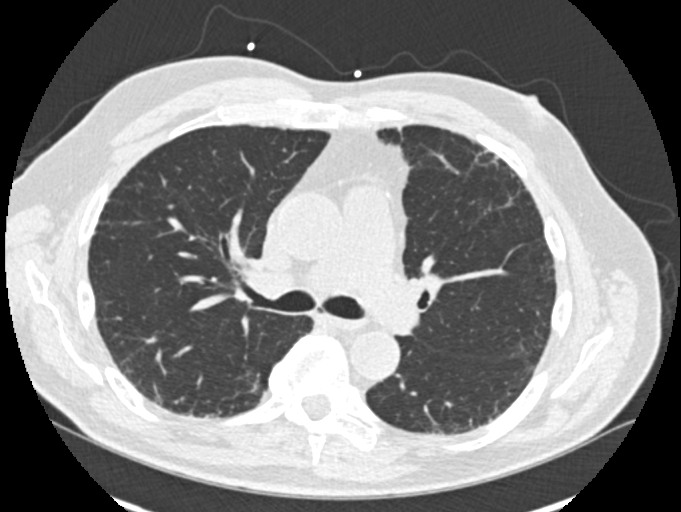

[Series 9: calcium scoring 2.00 br60 bestdiast 71% lungs · axial · 0.57mm/px · z∈[+1532,+1618]mm · 5 of 65 slices shown]
[im 11/65  vessel]
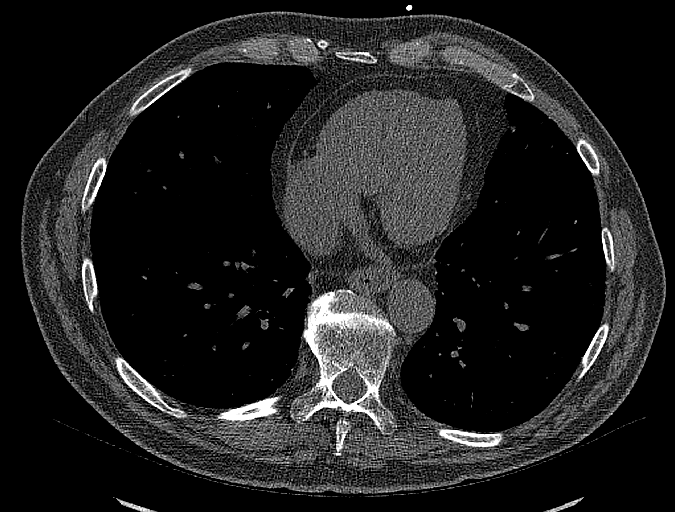
[im 22/65  vessel]
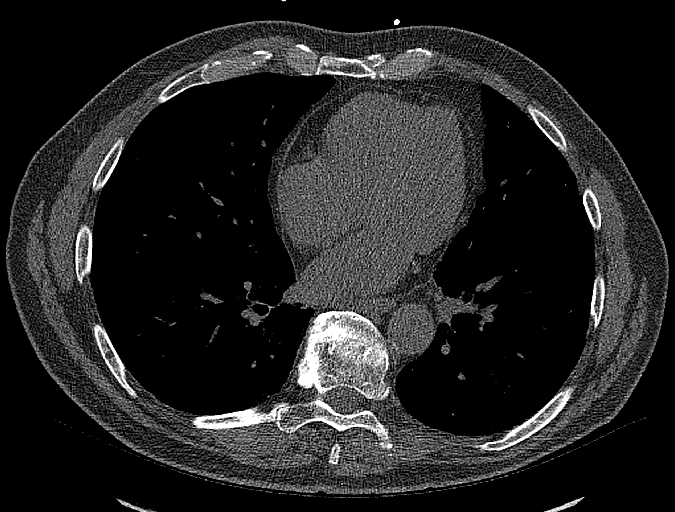
[im 33/65  vessel]
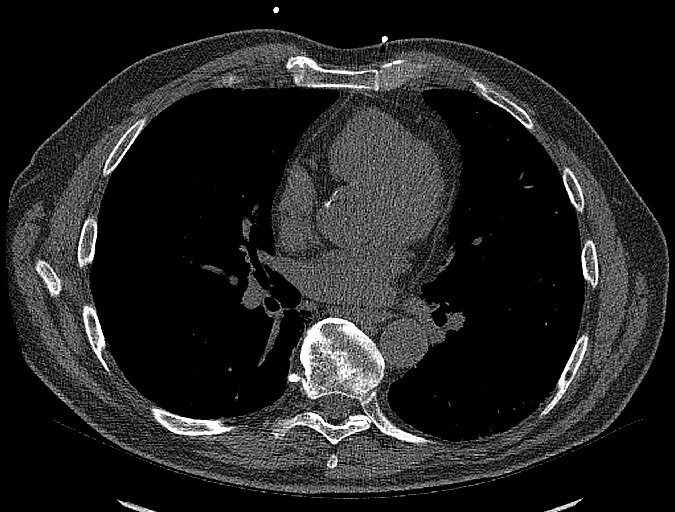
[im 43/65  vessel]
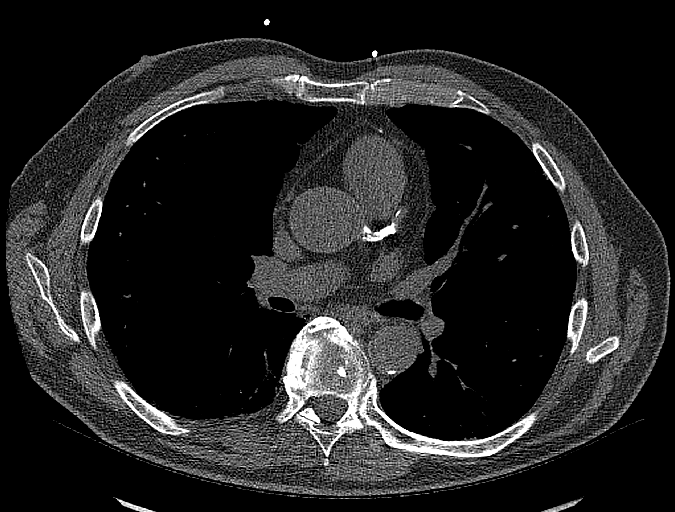
[im 54/65  vessel]
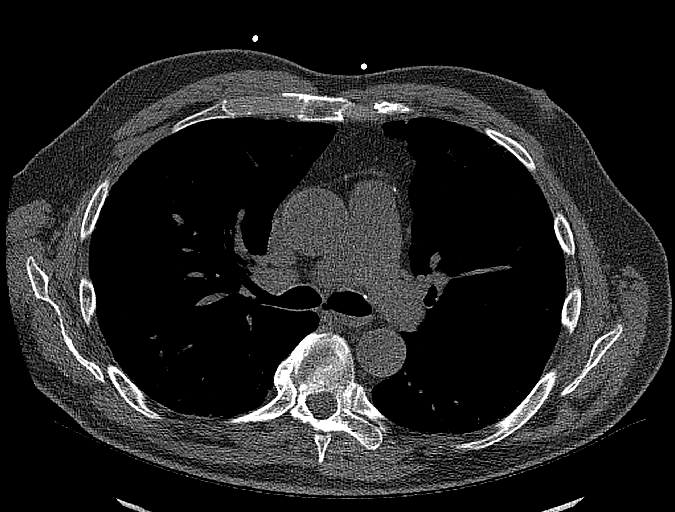

[13 of 20 positions shown; findings below may reference images not displayed]

FINDINGS: Technical quality: Good

CORONARY CALCIUM SCORES:

Left Main: 874

LAD: 305

LCx: 79

RCA: No coronary calcification. Calcification at the ostium is not
included in calculation.

CORONARY CALCIUM

Total Agatston Score: 8920

[HOSPITAL] percentile: 83

Ascending aorta (normal <  40 mm): 36 mm

EXTRACARDIAC FINDINGS:

Limited view of the lung parenchyma demonstrates mild subpleural
reticulation. No suspicious nodularity. Airways are normal.

Limited view of the mediastinum demonstrates no adenopathy.
Esophagus normal.

Limited view of the upper abdomen unremarkable.

Limited view of the skeleton and chest wall is unremarkable.
Degenerative osteophytosis of the spine.
IMPRESSION: 1. LEFT main, LAD and LEFT circumflex coronary artery calcification.

2. Total Agatston Score: 8920

3. MESA age and sex matched database percentile: 83

## 2021-05-26 DIAGNOSIS — L218 Other seborrheic dermatitis: Secondary | ICD-10-CM | POA: Diagnosis not present

## 2021-05-26 DIAGNOSIS — D692 Other nonthrombocytopenic purpura: Secondary | ICD-10-CM | POA: Diagnosis not present

## 2021-05-26 DIAGNOSIS — Z85828 Personal history of other malignant neoplasm of skin: Secondary | ICD-10-CM | POA: Diagnosis not present

## 2021-05-26 DIAGNOSIS — L570 Actinic keratosis: Secondary | ICD-10-CM | POA: Diagnosis not present

## 2021-05-26 DIAGNOSIS — L821 Other seborrheic keratosis: Secondary | ICD-10-CM | POA: Diagnosis not present

## 2021-07-28 DIAGNOSIS — H524 Presbyopia: Secondary | ICD-10-CM | POA: Diagnosis not present

## 2021-07-28 DIAGNOSIS — H43813 Vitreous degeneration, bilateral: Secondary | ICD-10-CM | POA: Diagnosis not present

## 2021-07-28 DIAGNOSIS — H2513 Age-related nuclear cataract, bilateral: Secondary | ICD-10-CM | POA: Diagnosis not present

## 2021-07-28 DIAGNOSIS — H353131 Nonexudative age-related macular degeneration, bilateral, early dry stage: Secondary | ICD-10-CM | POA: Diagnosis not present

## 2021-11-06 ENCOUNTER — Telehealth: Payer: Self-pay | Admitting: Cardiology

## 2021-11-06 DIAGNOSIS — R072 Precordial pain: Secondary | ICD-10-CM

## 2021-11-06 DIAGNOSIS — I2510 Atherosclerotic heart disease of native coronary artery without angina pectoris: Secondary | ICD-10-CM

## 2021-11-06 DIAGNOSIS — R002 Palpitations: Secondary | ICD-10-CM

## 2021-11-06 DIAGNOSIS — R0602 Shortness of breath: Secondary | ICD-10-CM

## 2021-11-06 NOTE — Telephone Encounter (Signed)
ETT ordered and annual appt scheduled. Pt will await call to schedule ETT as I cannot scheduled. Verbalized understanding.

## 2021-11-06 NOTE — Telephone Encounter (Signed)
Pt would like to know when he will be scheduled for his procedure... please advise

## 2021-11-06 NOTE — Telephone Encounter (Signed)
Patient is speaking of exercise tolerance test... please advise

## 2021-11-10 ENCOUNTER — Telehealth (HOSPITAL_COMMUNITY): Payer: Self-pay | Admitting: *Deleted

## 2021-11-10 NOTE — Telephone Encounter (Signed)
Close encounter 

## 2021-11-11 ENCOUNTER — Ambulatory Visit (HOSPITAL_COMMUNITY)
Admission: RE | Admit: 2021-11-11 | Discharge: 2021-11-11 | Disposition: A | Discharge status: Home / Self Care | Payer: Medicare PPO | Source: Ambulatory Visit | Attending: Cardiovascular Disease | Admitting: Cardiovascular Disease

## 2021-11-11 ENCOUNTER — Other Ambulatory Visit: Payer: Self-pay

## 2021-11-11 ENCOUNTER — Other Ambulatory Visit: Payer: Self-pay | Admitting: *Deleted

## 2021-11-11 DIAGNOSIS — I2510 Atherosclerotic heart disease of native coronary artery without angina pectoris: Secondary | ICD-10-CM | POA: Diagnosis not present

## 2021-11-11 DIAGNOSIS — R0602 Shortness of breath: Secondary | ICD-10-CM | POA: Insufficient documentation

## 2021-11-11 DIAGNOSIS — R002 Palpitations: Secondary | ICD-10-CM | POA: Diagnosis not present

## 2021-11-11 DIAGNOSIS — R072 Precordial pain: Secondary | ICD-10-CM

## 2021-11-11 LAB — EXERCISE TOLERANCE TEST
Angina Index: 0
Duke Treadmill Score: 6
Estimated workload: 7.2
Exercise duration (min): 6 min
Exercise duration (sec): 8 s
MPHR: 144 {beats}/min
Peak HR: 173 {beats}/min
Percent HR: 120 %
Rest HR: 80 {beats}/min
ST Depression (mm): 0 mm

## 2021-11-25 DIAGNOSIS — L570 Actinic keratosis: Secondary | ICD-10-CM | POA: Diagnosis not present

## 2021-11-25 DIAGNOSIS — L821 Other seborrheic keratosis: Secondary | ICD-10-CM | POA: Diagnosis not present

## 2021-11-25 DIAGNOSIS — Z85828 Personal history of other malignant neoplasm of skin: Secondary | ICD-10-CM | POA: Diagnosis not present

## 2021-11-27 DIAGNOSIS — R7301 Impaired fasting glucose: Secondary | ICD-10-CM | POA: Diagnosis not present

## 2021-11-27 DIAGNOSIS — I10 Essential (primary) hypertension: Secondary | ICD-10-CM | POA: Diagnosis not present

## 2021-11-27 DIAGNOSIS — E785 Hyperlipidemia, unspecified: Secondary | ICD-10-CM | POA: Diagnosis not present

## 2021-11-27 DIAGNOSIS — Z125 Encounter for screening for malignant neoplasm of prostate: Secondary | ICD-10-CM | POA: Diagnosis not present

## 2021-12-04 DIAGNOSIS — I2510 Atherosclerotic heart disease of native coronary artery without angina pectoris: Secondary | ICD-10-CM | POA: Diagnosis not present

## 2021-12-04 DIAGNOSIS — Z1331 Encounter for screening for depression: Secondary | ICD-10-CM | POA: Diagnosis not present

## 2021-12-04 DIAGNOSIS — E785 Hyperlipidemia, unspecified: Secondary | ICD-10-CM | POA: Diagnosis not present

## 2021-12-04 DIAGNOSIS — R7301 Impaired fasting glucose: Secondary | ICD-10-CM | POA: Diagnosis not present

## 2021-12-04 DIAGNOSIS — R82998 Other abnormal findings in urine: Secondary | ICD-10-CM | POA: Diagnosis not present

## 2021-12-04 DIAGNOSIS — Z8612 Personal history of poliomyelitis: Secondary | ICD-10-CM | POA: Diagnosis not present

## 2021-12-04 DIAGNOSIS — M21969 Unspecified acquired deformity of unspecified lower leg: Secondary | ICD-10-CM | POA: Diagnosis not present

## 2021-12-04 DIAGNOSIS — G8190 Hemiplegia, unspecified affecting unspecified side: Secondary | ICD-10-CM | POA: Diagnosis not present

## 2021-12-04 DIAGNOSIS — I10 Essential (primary) hypertension: Secondary | ICD-10-CM | POA: Diagnosis not present

## 2021-12-04 DIAGNOSIS — Z1339 Encounter for screening examination for other mental health and behavioral disorders: Secondary | ICD-10-CM | POA: Diagnosis not present

## 2021-12-04 DIAGNOSIS — Z0000 Encounter for general adult medical examination without abnormal findings: Secondary | ICD-10-CM | POA: Diagnosis not present

## 2021-12-04 DIAGNOSIS — L719 Rosacea, unspecified: Secondary | ICD-10-CM | POA: Diagnosis not present

## 2021-12-31 NOTE — Progress Notes (Signed)
?  ?Cardiology Office Note ? ? ?Date:  01/01/2022  ? ?ID:  Edward Frazier, DOB [birth year], MRN [id number] ? ?PCP:  Cleatis Polka., MD  ?Cardiologist:   None ?Referring:  Cleatis Polka., MD ? ?Chief Complaint  ?Patient presents with  ? Coronary Artery Disease  ? ? ?  ?History of Present Illness: ?Edward Frazier is a 76 y.o. male who presents for evaluation of elevated coronary calcium.  He has no history of coronary disease but did have some mild dyslipidemia.  He was sent for a coronary calcium score.  This came back elevated with a count of 874 in the left main, LAD 305 in the left circumflex 79.  Total was 1258 which put him at the 83rd percentile for his age.  He had a negative POET (Plain Old Exercise Treadmill) in March 2023. .   ? 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   The patient denies any new symptoms such as chest discomfort, neck or arm discomfort. There has been no new shortness of breath, PND or orthopnea. There have been no reported palpitations, presyncope or syncope.   ? ?Past Medical History:  ?Diagnosis Date  ? Hypertension   ? Polio   ? ? ?Past Surgical History:  ?Procedure Laterality Date  ? BONY PELVIS SURGERY    ? FEMUR SURGERY    ? KNEE SURGERY    ? ? ? ?Current Outpatient Medications  ?Medication Sig Dispense Refill  ? ASPIRIN 81 PO Take by mouth daily.    ? doxycycline (VIBRA-TABS) 100 MG tablet takes PRN for rosacea    ? lisinopril (PRINIVIL,ZESTRIL) 20 MG tablet     ? rosuvastatin (CRESTOR) 20 MG tablet     ? ?No current facility-administered medications for this visit.  ? ? ?Allergies:   Penicillin g and Penicillins  ? ? ?ROS:  Please see the history of present illness.   Otherwise, review of systems are positive for none.   All other systems are reviewed and negative.  ? ? ?PHYSICAL EXAM: ?VS:  BP 122/64   Pulse 72   Ht 5\' 7"  (1.702 m)   Wt 170 lb 9.6 oz (77.4 kg)   SpO2 97%   BMI 26.72 kg/m?  , BMI Body mass index is 26.72 kg/m?. ?GENERAL:  Well appearing ?NECK:   No jugular venous distention, waveform within normal limits, carotid upstroke brisk and symmetric, no bruits, no thyromegaly ?LUNGS:  Clear to auscultation bilaterally ?CHEST:  Unremarkable ?HEART:  PMI not displaced or sustained,S1 and S2 within normal limits, no S3, no S4, no clicks, no rubs, no murmurs ?ABD:  Flat, positive bowel sounds normal in frequency in pitch, no bruits, no rebound, no guarding, no midline pulsatile mass, no hepatomegaly, no splenomegaly ?EXT:  2 plus pulses throughout, no edema, no cyanosis no clubbing ? ? ?EKG:  EKG is  ordered today. ?The ekg ordered today demonstrates sinus rhythm, rate 72, axis within normal limits, intervals within normal limits, no acute ST-T wave changes. ? ? ?Recent Labs: ?No results found for requested labs within last 8760 hours.  ? ? ?Lipid Panel ?No results found for: CHOL, TRIG, HDL, CHOLHDL, VLDL, LDLCALC, LDLDIRECT ?  ? ?Wt Readings from Last 3 Encounters:  ?01/01/22 170 lb 9.6 oz (77.4 kg)  ?01/06/21 166 lb 3.2 oz (75.4 kg)  ?12/26/19 171 lb 6.4 oz (77.7 kg)  ?  ? ? ?Other studies Reviewed: ?Additional studies/ records that were reviewed today include:  POET (Plain Old Exercise Treadmill), labs ?Review of the above records demonstrates:  Please see elsewhere in the note.   ? ? ?ASSESSMENT AND PLAN: ? ?CAD:   He had a negative POET (Plain Old Exercise Treadmill) earlier this year. The patient has no new sypmtoms.  No further cardiovascular testing is indicated.  We will continue with aggressive risk reduction and meds as listed. ? ?DYSLIPIDEMIA:  LDL was 40.  No change in therapy.  ? ?Current medicines are reviewed at length with the patient today.  The patient does not have concerns regarding medicines. ? ?The following changes have been made:   None ? ?Labs/ tests ordered today include:  None ? ?Orders Placed This Encounter  ?Procedures  ? EKG 12-Lead  ? ? ? ?Disposition:   FU with me in 12 months.   ? ? ?Signed, ?Rollene Rotunda, MD  ?01/01/2022 10:11 AM     ?[facility] ? ? ?

## 2022-01-01 ENCOUNTER — Encounter: Payer: Self-pay | Admitting: Cardiology

## 2022-01-01 ENCOUNTER — Ambulatory Visit: Payer: Medicare PPO | Admitting: Cardiology

## 2022-01-01 VITALS — BP 122/64 | HR 72 | Ht 67.0 in | Wt 170.6 lb

## 2022-01-01 DIAGNOSIS — E785 Hyperlipidemia, unspecified: Secondary | ICD-10-CM

## 2022-01-01 DIAGNOSIS — I2510 Atherosclerotic heart disease of native coronary artery without angina pectoris: Secondary | ICD-10-CM | POA: Diagnosis not present

## 2022-01-01 NOTE — Patient Instructions (Signed)
Medication Instructions:  ?No changes ?*If you need a refill on your cardiac medications before your next appointment, please call your pharmacy* ? ? ?Lab Work: ?None ordered ?If you have labs (blood work) drawn today and your tests are completely normal, you will receive your results only by: ?MyChart Message (if you have MyChart) OR ?A paper copy in the mail ?If you have any lab test that is abnormal or we need to change your treatment, we will call you to review the results. ? ? ?Testing/Procedures: ?None ordered ? ? ?Follow-Up: ?At [facility], you and your health needs are our priority.  As part of our continuing mission to provide you with exceptional heart care, we have created designated Provider Care Teams.  These Care Teams include your primary Cardiologist (physician) and Advanced Practice Providers (APPs -  Physician Assistants and Nurse Practitioners) who all work together to provide you with the care you need, when you need it. ? ?We recommend signing up for the patient portal called "MyChart".  Sign up information is provided on this After Visit Summary.  MyChart is used to connect with patients for Virtual Visits (Telemedicine).  Patients are able to view lab/test results, encounter notes, upcoming appointments, etc.  Non-urgent messages can be sent to your provider as well.   ?To learn more about what you can do with MyChart, go to [email or web address].   ? ?Your next appointment:   ?12 month(s) ? ?The format for your next appointment:   ?In Person ? ?Provider:   ?Dr. Antoine Poche ? ?Important Information About Sugar ? ? ? ? ? ? ?

## 2022-05-27 DIAGNOSIS — Z85828 Personal history of other malignant neoplasm of skin: Secondary | ICD-10-CM | POA: Diagnosis not present

## 2022-05-27 DIAGNOSIS — D0439 Carcinoma in situ of skin of other parts of face: Secondary | ICD-10-CM | POA: Diagnosis not present

## 2022-05-27 DIAGNOSIS — L821 Other seborrheic keratosis: Secondary | ICD-10-CM | POA: Diagnosis not present

## 2022-05-27 DIAGNOSIS — D485 Neoplasm of uncertain behavior of skin: Secondary | ICD-10-CM | POA: Diagnosis not present

## 2022-05-27 DIAGNOSIS — C44629 Squamous cell carcinoma of skin of left upper limb, including shoulder: Secondary | ICD-10-CM | POA: Diagnosis not present

## 2022-05-27 DIAGNOSIS — L570 Actinic keratosis: Secondary | ICD-10-CM | POA: Diagnosis not present

## 2022-07-02 ENCOUNTER — Ambulatory Visit: Payer: Medicare PPO | Admitting: Podiatry

## 2022-07-02 DIAGNOSIS — M79676 Pain in unspecified toe(s): Secondary | ICD-10-CM

## 2022-07-02 DIAGNOSIS — B351 Tinea unguium: Secondary | ICD-10-CM

## 2022-07-02 NOTE — Progress Notes (Signed)
This patient returns to my office for at risk foot care.  This patient requires this care by a professional since this patient will be at risk due to having severely contracted digits right foot due to polio This patient is unable to cut nails himself since the patient cannot reach his nails.These nails are painful walking and wearing shoes.    Patient has not been seen in over 16 months.This patient presents for at risk foot care today.  General Appearance  Alert, conversant and in no acute stress.  Vascular  Dorsalis pedis and posterior tibial  pulses are palpable  bilaterally.  Capillary return is within normal limits  bilaterally. Temperature is within normal limits  bilaterally.  Neurologic  Senn-Weinstein monofilament wire test within normal limits  bilaterally. Muscle power within normal limits bilaterally.  Nails Thick disfigured discolored nails with subungual debris  from hallux to fifth toes bilaterally. No evidence of bacterial infection or drainage bilaterally.  Especially thick painful toenail right foot.  Orthopedic  No limitations of motion  feet .  No crepitus or effusions noted.  Contracted digits right foot due to polio.  Skin  normotropic skin with no porokeratosis noted bilaterally.  No signs of infections or ulcers noted.  Asymptomatic clavi   Onychomycosis  Pain in right toes  Pain in left toes  Consent was obtained for treatment procedures.   Mechanical debridement of nails 1-5  bilaterally performed with a nail nipper.  Filed with dremel without incident.  Third toenail is unattached and removed.  Painfree when leaving.  Cauterized 4th toe right foot and DSD applied.   Return office visit    10 weeks                Told patient to return for periodic foot care and evaluation due to potential at risk complications.   Gardiner Barefoot DPM

## 2022-07-08 DIAGNOSIS — C44629 Squamous cell carcinoma of skin of left upper limb, including shoulder: Secondary | ICD-10-CM | POA: Diagnosis not present

## 2022-07-08 DIAGNOSIS — D485 Neoplasm of uncertain behavior of skin: Secondary | ICD-10-CM | POA: Diagnosis not present

## 2022-07-08 DIAGNOSIS — Z85828 Personal history of other malignant neoplasm of skin: Secondary | ICD-10-CM | POA: Diagnosis not present

## 2022-08-10 DIAGNOSIS — H43813 Vitreous degeneration, bilateral: Secondary | ICD-10-CM | POA: Diagnosis not present

## 2022-08-10 DIAGNOSIS — H5213 Myopia, bilateral: Secondary | ICD-10-CM | POA: Diagnosis not present

## 2022-08-10 DIAGNOSIS — H2513 Age-related nuclear cataract, bilateral: Secondary | ICD-10-CM | POA: Diagnosis not present

## 2022-08-10 DIAGNOSIS — H524 Presbyopia: Secondary | ICD-10-CM | POA: Diagnosis not present

## 2022-08-10 DIAGNOSIS — H353132 Nonexudative age-related macular degeneration, bilateral, intermediate dry stage: Secondary | ICD-10-CM | POA: Diagnosis not present

## 2022-09-17 ENCOUNTER — Ambulatory Visit: Payer: Medicare PPO | Admitting: Podiatry

## 2022-11-25 DIAGNOSIS — L821 Other seborrheic keratosis: Secondary | ICD-10-CM | POA: Diagnosis not present

## 2022-11-25 DIAGNOSIS — D692 Other nonthrombocytopenic purpura: Secondary | ICD-10-CM | POA: Diagnosis not present

## 2022-11-25 DIAGNOSIS — L570 Actinic keratosis: Secondary | ICD-10-CM | POA: Diagnosis not present

## 2022-11-25 DIAGNOSIS — D485 Neoplasm of uncertain behavior of skin: Secondary | ICD-10-CM | POA: Diagnosis not present

## 2022-11-25 DIAGNOSIS — Z85828 Personal history of other malignant neoplasm of skin: Secondary | ICD-10-CM | POA: Diagnosis not present

## 2022-11-25 DIAGNOSIS — D0421 Carcinoma in situ of skin of right ear and external auricular canal: Secondary | ICD-10-CM | POA: Diagnosis not present

## 2022-12-15 DIAGNOSIS — R7301 Impaired fasting glucose: Secondary | ICD-10-CM | POA: Diagnosis not present

## 2022-12-15 DIAGNOSIS — Z125 Encounter for screening for malignant neoplasm of prostate: Secondary | ICD-10-CM | POA: Diagnosis not present

## 2022-12-15 DIAGNOSIS — E785 Hyperlipidemia, unspecified: Secondary | ICD-10-CM | POA: Diagnosis not present

## 2022-12-15 DIAGNOSIS — I10 Essential (primary) hypertension: Secondary | ICD-10-CM | POA: Diagnosis not present

## 2022-12-22 DIAGNOSIS — R7301 Impaired fasting glucose: Secondary | ICD-10-CM | POA: Diagnosis not present

## 2022-12-22 DIAGNOSIS — I10 Essential (primary) hypertension: Secondary | ICD-10-CM | POA: Diagnosis not present

## 2022-12-22 DIAGNOSIS — G8190 Hemiplegia, unspecified affecting unspecified side: Secondary | ICD-10-CM | POA: Diagnosis not present

## 2022-12-22 DIAGNOSIS — Z1339 Encounter for screening examination for other mental health and behavioral disorders: Secondary | ICD-10-CM | POA: Diagnosis not present

## 2022-12-22 DIAGNOSIS — Z8612 Personal history of poliomyelitis: Secondary | ICD-10-CM | POA: Diagnosis not present

## 2022-12-22 DIAGNOSIS — Z0000 Encounter for general adult medical examination without abnormal findings: Secondary | ICD-10-CM | POA: Diagnosis not present

## 2022-12-22 DIAGNOSIS — Z1331 Encounter for screening for depression: Secondary | ICD-10-CM | POA: Diagnosis not present

## 2022-12-22 DIAGNOSIS — E785 Hyperlipidemia, unspecified: Secondary | ICD-10-CM | POA: Diagnosis not present

## 2023-01-26 NOTE — Progress Notes (Signed)
  Cardiology Office Note:   Date:  01/28/2023  ID:  Edward Frazier, DOB 01/13/1946, MRN [id number]  History of Present Illness:   Edward Frazier is a 77 y.o. male who presents for evaluation of elevated coronary calcium.  He has no history of coronary disease but did have some mild dyslipidemia.  He was sent for a coronary calcium score.  This came back elevated with a count of 874 in the left main, LAD 305 in the left circumflex 79.  Total was 1258 which put him at the 83rd percentile for his age.  He had a negative POET (Plain Old Exercise Treadmill) in March 2023.   Since I last saw him he has had no new cardiovascular complaints.  The patient denies any new symptoms such as chest discomfort, neck or arm discomfort. There has been no new shortness of breath, PND or orthopnea. There have been no reported palpitations, presyncope or syncope.  He is walking.  He is planning on joining the [employer].     ROS: As stated in the HPI and negative for all other systems.  Studies Reviewed:    EKG: Normal sinus rhythm, rate 70, axis within normal limits, intervals within normal limits, no acute ST-T wave changes.   Risk Assessment/Calculations:      Physical Exam:   VS:  BP 120/64   Pulse 70   Ht 5\' 7"  (1.702 m)   Wt 178 lb 9.6 oz (81 kg)   SpO2 96%   BMI 27.97 kg/m    Wt Readings from Last 3 Encounters:  01/28/23 178 lb 9.6 oz (81 kg)  01/01/22 170 lb 9.6 oz (77.4 kg)  01/06/21 166 lb 3.2 oz (75.4 kg)     GEN: Well nourished, well developed in no acute distress NECK: No JVD; possible left carotid bruits CARDIAC: RRR, no murmurs, rubs, gallops RESPIRATORY:  Clear to auscultation without rales, wheezing or rhonchi  ABDOMEN: Soft, non-tender, non-distended EXTREMITIES:  No edema; No deformity   ASSESSMENT AND PLAN:   CAD:  The patient has no new sypmtoms.  No further cardiovascular testing is indicated.  We will continue with aggressive risk reduction and meds as listed.   DYSLIPIDEMIA:  LDL  was 42 with an HDL of 49.  No change in therapy.   Bruit: I will order carotid Dopplers.        Signed, Rollene Rotunda, MD

## 2023-01-28 ENCOUNTER — Encounter: Payer: Self-pay | Admitting: Cardiology

## 2023-01-28 ENCOUNTER — Ambulatory Visit: Payer: Medicare PPO | Attending: Cardiology | Admitting: Cardiology

## 2023-01-28 VITALS — BP 120/64 | HR 70 | Ht 67.0 in | Wt 178.6 lb

## 2023-01-28 DIAGNOSIS — I2510 Atherosclerotic heart disease of native coronary artery without angina pectoris: Secondary | ICD-10-CM | POA: Diagnosis not present

## 2023-01-28 DIAGNOSIS — R0989 Other specified symptoms and signs involving the circulatory and respiratory systems: Secondary | ICD-10-CM

## 2023-01-28 DIAGNOSIS — E785 Hyperlipidemia, unspecified: Secondary | ICD-10-CM

## 2023-01-28 NOTE — Patient Instructions (Signed)
    Testing/Procedures:  Your physician has requested that you have a carotid duplex. This test is an ultrasound of the carotid arteries in your neck. It looks at blood flow through these arteries that supply the brain with blood. Allow one hour for this exam. There are no restrictions or special instructions. [facility]   Follow-Up: At [facility], you and your health needs are our priority.  As part of our continuing mission to provide you with exceptional heart care, we have created designated Provider Care Teams.  These Care Teams include your primary Cardiologist (physician) and Advanced Practice Providers (APPs -  Physician Assistants and Nurse Practitioners) who all work together to provide you with the care you need, when you need it.  We recommend signing up for the patient portal called "MyChart".  Sign up information is provided on this After Visit Summary.  MyChart is used to connect with patients for Virtual Visits (Telemedicine).  Patients are able to view lab/test results, encounter notes, upcoming appointments, etc.  Non-urgent messages can be sent to your provider as well.   To learn more about what you can do with MyChart, go to [email or web address].    Your next appointment:   12 month(s)  Provider:   Rollene Rotunda MD

## 2023-02-11 ENCOUNTER — Ambulatory Visit (HOSPITAL_COMMUNITY)
Admission: RE | Admit: 2023-02-11 | Discharge: 2023-02-11 | Disposition: A | Discharge status: Home / Self Care | Payer: Medicare PPO | Source: Ambulatory Visit | Attending: Cardiology | Admitting: Cardiology

## 2023-02-11 DIAGNOSIS — R0989 Other specified symptoms and signs involving the circulatory and respiratory systems: Secondary | ICD-10-CM | POA: Diagnosis not present

## 2023-05-03 ENCOUNTER — Emergency Department (HOSPITAL_COMMUNITY): Payer: Medicare PPO

## 2023-05-03 ENCOUNTER — Other Ambulatory Visit: Payer: Self-pay

## 2023-05-03 ENCOUNTER — Inpatient Hospital Stay (HOSPITAL_COMMUNITY)
Admission: EM | Admit: 2023-05-03 | Discharge: 2023-05-06 | DRG: 440 | Disposition: A | Discharge status: Home / Self Care | Payer: Medicare PPO | Attending: Internal Medicine | Admitting: Internal Medicine

## 2023-05-03 ENCOUNTER — Encounter (HOSPITAL_COMMUNITY): Payer: Self-pay | Admitting: Emergency Medicine

## 2023-05-03 DIAGNOSIS — Z6827 Body mass index (BMI) 27.0-27.9, adult: Secondary | ICD-10-CM | POA: Diagnosis not present

## 2023-05-03 DIAGNOSIS — Z79899 Other long term (current) drug therapy: Secondary | ICD-10-CM

## 2023-05-03 DIAGNOSIS — R609 Edema, unspecified: Secondary | ICD-10-CM | POA: Diagnosis not present

## 2023-05-03 DIAGNOSIS — Z7982 Long term (current) use of aspirin: Secondary | ICD-10-CM | POA: Diagnosis not present

## 2023-05-03 DIAGNOSIS — K859 Acute pancreatitis without necrosis or infection, unspecified: Secondary | ICD-10-CM | POA: Diagnosis not present

## 2023-05-03 DIAGNOSIS — K8501 Idiopathic acute pancreatitis with uninfected necrosis: Secondary | ICD-10-CM | POA: Diagnosis not present

## 2023-05-03 DIAGNOSIS — R61 Generalized hyperhidrosis: Secondary | ICD-10-CM | POA: Diagnosis not present

## 2023-05-03 DIAGNOSIS — R918 Other nonspecific abnormal finding of lung field: Secondary | ICD-10-CM | POA: Diagnosis present

## 2023-05-03 DIAGNOSIS — Z880 Allergy status to penicillin: Secondary | ICD-10-CM

## 2023-05-03 DIAGNOSIS — Z8249 Family history of ischemic heart disease and other diseases of the circulatory system: Secondary | ICD-10-CM | POA: Diagnosis not present

## 2023-05-03 DIAGNOSIS — R079 Chest pain, unspecified: Secondary | ICD-10-CM | POA: Diagnosis not present

## 2023-05-03 DIAGNOSIS — R000 Tachycardia, unspecified: Secondary | ICD-10-CM | POA: Diagnosis not present

## 2023-05-03 DIAGNOSIS — M546 Pain in thoracic spine: Secondary | ICD-10-CM | POA: Diagnosis not present

## 2023-05-03 DIAGNOSIS — I272 Pulmonary hypertension, unspecified: Secondary | ICD-10-CM | POA: Diagnosis not present

## 2023-05-03 DIAGNOSIS — M549 Dorsalgia, unspecified: Principal | ICD-10-CM

## 2023-05-03 DIAGNOSIS — I10 Essential (primary) hypertension: Secondary | ICD-10-CM | POA: Diagnosis not present

## 2023-05-03 DIAGNOSIS — J90 Pleural effusion, not elsewhere classified: Secondary | ICD-10-CM | POA: Diagnosis not present

## 2023-05-03 DIAGNOSIS — Z87891 Personal history of nicotine dependence: Secondary | ICD-10-CM | POA: Diagnosis not present

## 2023-05-03 DIAGNOSIS — R739 Hyperglycemia, unspecified: Secondary | ICD-10-CM | POA: Diagnosis present

## 2023-05-03 DIAGNOSIS — E669 Obesity, unspecified: Secondary | ICD-10-CM | POA: Diagnosis present

## 2023-05-03 DIAGNOSIS — R932 Abnormal findings on diagnostic imaging of liver and biliary tract: Secondary | ICD-10-CM | POA: Diagnosis not present

## 2023-05-03 DIAGNOSIS — E785 Hyperlipidemia, unspecified: Secondary | ICD-10-CM | POA: Diagnosis not present

## 2023-05-03 DIAGNOSIS — K8020 Calculus of gallbladder without cholecystitis without obstruction: Secondary | ICD-10-CM | POA: Diagnosis present

## 2023-05-03 DIAGNOSIS — R0989 Other specified symptoms and signs involving the circulatory and respiratory systems: Secondary | ICD-10-CM | POA: Diagnosis not present

## 2023-05-03 DIAGNOSIS — G14 Postpolio syndrome: Secondary | ICD-10-CM | POA: Diagnosis not present

## 2023-05-03 DIAGNOSIS — R0789 Other chest pain: Secondary | ICD-10-CM | POA: Diagnosis not present

## 2023-05-03 DIAGNOSIS — K5900 Constipation, unspecified: Secondary | ICD-10-CM | POA: Diagnosis not present

## 2023-05-03 DIAGNOSIS — K8500 Idiopathic acute pancreatitis without necrosis or infection: Secondary | ICD-10-CM | POA: Diagnosis not present

## 2023-05-03 DIAGNOSIS — K8689 Other specified diseases of pancreas: Secondary | ICD-10-CM | POA: Diagnosis not present

## 2023-05-03 DIAGNOSIS — K828 Other specified diseases of gallbladder: Secondary | ICD-10-CM | POA: Diagnosis not present

## 2023-05-03 DIAGNOSIS — R140 Abdominal distension (gaseous): Secondary | ICD-10-CM | POA: Diagnosis not present

## 2023-05-03 LAB — I-STAT CHEM 8, ED
BUN: 21 mg/dL (ref 8–23)
Calcium, Ion: 1.1 mmol/L — ABNORMAL LOW (ref 1.15–1.40)
Chloride: 106 mmol/L (ref 98–111)
Creatinine, Ser: 0.8 mg/dL (ref 0.61–1.24)
Glucose, Bld: 168 mg/dL — ABNORMAL HIGH (ref 70–99)
HCT: 41 % (ref 39.0–52.0)
Hemoglobin: 13.9 g/dL (ref 13.0–17.0)
Potassium: 3.8 mmol/L (ref 3.5–5.1)
Sodium: 140 mmol/L (ref 135–145)
TCO2: 23 mmol/L (ref 22–32)

## 2023-05-03 LAB — TROPONIN I (HIGH SENSITIVITY): Troponin I (High Sensitivity): 10 ng/L (ref <18)

## 2023-05-03 LAB — COMPREHENSIVE METABOLIC PANEL
ALT: 32 U/L (ref 0–44)
AST: 59 U/L — ABNORMAL HIGH (ref 15–41)
Albumin: 3.4 g/dL — ABNORMAL LOW (ref 3.5–5.0)
Alkaline Phosphatase: 63 U/L (ref 38–126)
Anion gap: 11 (ref 5–15)
BUN: 20 mg/dL (ref 8–23)
CO2: 22 mmol/L (ref 22–32)
Calcium: 8.6 mg/dL — ABNORMAL LOW (ref 8.9–10.3)
Chloride: 105 mmol/L (ref 98–111)
Creatinine, Ser: 0.86 mg/dL (ref 0.61–1.24)
GFR, Estimated: >60 mL/min (ref >60)
Glucose, Bld: 170 mg/dL — ABNORMAL HIGH (ref 70–99)
Potassium: 3.8 mmol/L (ref 3.5–5.1)
Sodium: 138 mmol/L (ref 135–145)
Total Bilirubin: 1 mg/dL (ref 0.3–1.2)
Total Protein: 6.9 g/dL (ref 6.5–8.1)

## 2023-05-03 LAB — CBC WITH DIFFERENTIAL/PLATELET
Abs Immature Granulocytes: 0.03 10*3/uL (ref 0.00–0.07)
Basophils Absolute: 0 10*3/uL (ref 0.0–0.1)
Basophils Relative: 0 %
Eosinophils Absolute: 0.2 10*3/uL (ref 0.0–0.5)
Eosinophils Relative: 2 %
HCT: 42 % (ref 39.0–52.0)
Hemoglobin: 14.1 g/dL (ref 13.0–17.0)
Immature Granulocytes: 0 %
Lymphocytes Relative: 21 %
Lymphs Abs: 2 10*3/uL (ref 0.7–4.0)
MCH: 33.1 pg (ref 26.0–34.0)
MCHC: 33.6 g/dL (ref 30.0–36.0)
MCV: 98.6 fL (ref 80.0–100.0)
Monocytes Absolute: 0.4 10*3/uL (ref 0.1–1.0)
Monocytes Relative: 5 %
Neutro Abs: 6.9 10*3/uL (ref 1.7–7.7)
Neutrophils Relative %: 72 %
Platelets: 160 10*3/uL (ref 150–400)
RBC: 4.26 MIL/uL (ref 4.22–5.81)
RDW: 12.7 % (ref 11.5–15.5)
WBC: 9.6 10*3/uL (ref 4.0–10.5)
nRBC: 0 % (ref 0.0–0.2)

## 2023-05-03 LAB — D-DIMER, QUANTITATIVE: D-Dimer, Quant: 0.61 ug{FEU}/mL — ABNORMAL HIGH (ref 0.00–0.50)

## 2023-05-03 LAB — LIPASE, BLOOD: Lipase: 2378 U/L — ABNORMAL HIGH (ref 11–51)

## 2023-05-03 MED ORDER — OXYCODONE HCL 5 MG PO TABS
5.0000 mg | ORAL_TABLET | ORAL | Status: DC | PRN
  Filled 2023-05-03: qty 1

## 2023-05-03 MED ORDER — IOHEXOL 350 MG/ML SOLN
75.0000 mL | Freq: Once | INTRAVENOUS | Status: AC | PRN
  Administered 2023-05-03: 75 mL via INTRAVENOUS

## 2023-05-03 MED ORDER — ONDANSETRON HCL 4 MG/2ML IJ SOLN
4.0000 mg | Freq: Once | INTRAMUSCULAR | Status: AC
  Administered 2023-05-03: 4 mg via INTRAVENOUS
  Filled 2023-05-03: qty 2

## 2023-05-03 MED ORDER — FENTANYL CITRATE PF 50 MCG/ML IJ SOSY
50.0000 ug | PREFILLED_SYRINGE | Freq: Once | INTRAMUSCULAR | Status: AC
  Administered 2023-05-03: 50 ug via INTRAVENOUS
  Filled 2023-05-03: qty 1

## 2023-05-03 MED ORDER — ROSUVASTATIN CALCIUM 20 MG PO TABS: 20.0000 mg | ORAL_TABLET | Freq: Every morning | ORAL | Status: DC

## 2023-05-03 MED ORDER — MORPHINE SULFATE (PF) 2 MG/ML IV SOLN
2.0000 mg | INTRAVENOUS | Status: DC | PRN
  Administered 2023-05-03 – 2023-05-05 (×8): 2 mg via INTRAVENOUS
  Filled 2023-05-03 (×8): qty 1

## 2023-05-03 MED ORDER — DOXYCYCLINE HYCLATE 100 MG PO TABS
100.0000 mg | ORAL_TABLET | ORAL | Status: DC
  Administered 2023-05-05: 100 mg via ORAL
  Filled 2023-05-03: qty 1

## 2023-05-03 MED ORDER — ONDANSETRON HCL 4 MG/2ML IJ SOLN
4.0000 mg | Freq: Four times a day (QID) | INTRAMUSCULAR | Status: DC | PRN
  Administered 2023-05-03 – 2023-05-05 (×4): 4 mg via INTRAVENOUS
  Filled 2023-05-03 (×4): qty 2

## 2023-05-03 MED ORDER — LACTATED RINGERS IV BOLUS
1000.0000 mL | Freq: Once | INTRAVENOUS | Status: AC
  Administered 2023-05-03: 1000 mL via INTRAVENOUS

## 2023-05-03 MED ORDER — ENOXAPARIN SODIUM 40 MG/0.4ML IJ SOSY
40.0000 mg | PREFILLED_SYRINGE | INTRAMUSCULAR | Status: DC
  Administered 2023-05-03 – 2023-05-06 (×4): 40 mg via SUBCUTANEOUS
  Filled 2023-05-03 (×4): qty 0.4

## 2023-05-03 MED ORDER — ONDANSETRON HCL 4 MG PO TABS
4.0000 mg | ORAL_TABLET | Freq: Four times a day (QID) | ORAL | Status: DC | PRN
  Administered 2023-05-05 – 2023-05-06 (×2): 4 mg via ORAL
  Filled 2023-05-03 (×3): qty 1

## 2023-05-03 MED ORDER — MORPHINE SULFATE (PF) 4 MG/ML IV SOLN
4.0000 mg | Freq: Once | INTRAVENOUS | Status: AC
  Administered 2023-05-03: 4 mg via INTRAVENOUS
  Filled 2023-05-03: qty 1

## 2023-05-03 MED ORDER — ASPIRIN 81 MG PO CHEW
81.0000 mg | CHEWABLE_TABLET | Freq: Every morning | ORAL | Status: DC
  Administered 2023-05-03 – 2023-05-06 (×4): 81 mg via ORAL
  Filled 2023-05-03 (×4): qty 1

## 2023-05-03 MED ORDER — KCL-LACTATED RINGERS-D5W 20 MEQ/L IV SOLN
INTRAVENOUS | Status: DC
  Filled 2023-05-03 (×4): qty 1000

## 2023-05-03 MED ORDER — ACETAMINOPHEN 325 MG PO TABS
650.0000 mg | ORAL_TABLET | Freq: Four times a day (QID) | ORAL | Status: DC | PRN
  Administered 2023-05-05 (×2): 650 mg via ORAL
  Filled 2023-05-03 (×2): qty 2

## 2023-05-03 MED ORDER — LISINOPRIL 20 MG PO TABS
20.0000 mg | ORAL_TABLET | Freq: Every morning | ORAL | Status: DC
  Administered 2023-05-03 – 2023-05-06 (×4): 20 mg via ORAL
  Filled 2023-05-03 (×4): qty 1

## 2023-05-03 NOTE — ED Triage Notes (Signed)
Patient from home  BIB [facility] w/ complaints of initial chest pain that started 30 minutes prior to EMS arrival. Patient states this has since resolved but moreso feels like its in the middle back now. Unable to describe the discomfort but just states he can't get comfortable. EMS reports he was diaphoretic  and pale on arrival. Patient self administered 325 of ASA PTA and EMS gave one SL nitroglycerin with no relief. EMS reports initially patients HR was in the 120s but suddenly dropped to 60. VSS per EMS. 18 g LAC.

## 2023-05-03 NOTE — ED Provider Notes (Signed)
Pt signed out by Dr. Manus Gunning pending symptomatic improvement.  Pt is feeling better than he did, but he still has a lot of discomfort.  Pt has never had pancreatitis before.  He does not drink alcohol.  He has a small gallstone on ct, but tb is nl.  Pt d/w Dr. Jarvis Newcomer ([facility]) for admission.   Jacalyn Lefevre, MD 05/03/23 [phone]

## 2023-05-03 NOTE — H&P (Addendum)
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  He was in his usual state of health when going to bed the night before.  His pain persisted, and therefore he summoned EMS. He was given aspirin and nitroglycerin by EMS without improvement. CTa chest abdom pelvis noted findings consistent with acute pancreatitis.  Lipase was found to be significantly elevated at approximately 2400. He denies EtOH consumption. He has no prior hx of pancreatitis, and ROS does not reveal any sx suggestive of chronic GB disease. His pain improved w/ IV morphine, but has not resolved. He denies cp, sob, n/v, diarrhea, or HA. He is not hungry, and pain is worsened with attempts at oral intake.   Assessment/Plan  Acute idiopathic pancreatitis CT noted cholelithiasis but no evidence of acute cholecystitis - no hx of EtOH consumption - no classic offending medications - perhaps he passes a stone last night - otherwise, this may simply be a case of idiopathic acute pancreatitis - his ACEi could be the cause, but to my knowledge this is quite rare so I will not stop it for now - hold statin for now until oral intake resumes - check triglycerides in AM - supportive care w/ IVF - monitor lytes - allow clears for now, but only in small volume - counseled patient at length on diagnosis, etiologies, and treatment plan   Findings on CT chest to suggest 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 and chronic interstitial pulmonary disease Would benefit from outpt Pulmonary follow up - will make ambulatory  referral at time of d/c   Right upper lobe pulmonary nodules x 2 Patient is a former smoker - I have discussed these findings with the patient, the possibility this could represent malignancy (though not presently highly suspicious), and the need for ongoing surveillance - I have informed both he and his wife that he will need follow-up CT chest in 3-6 months as outpatient for monitoring (this can be arranged by his PCP or via Pulmonary if the timing of his visit allows)  Hyperglycemia No history of DM -likely simply a consequence of acute inflammation of the pancreas -check A1c in a.m. -will not monitor CBGs for now  HTN BP modestly elevated presently - continue usual home meds and follow - pain likely contributing so will not add a new agent at this time   HLD Check triglycerides in AM - hold statin until pancreatitis improved and oral intake resumed   DVT prophylaxis: Lovenox Code Status: FULL Family Communication: spoke with wife at bedside at length  Disposition Plan:  Admit to Inpatient   Review of Systems: As per HPI otherwise 10 point review of systems negative.   Past Medical History:  Diagnosis Date   Hypertension    Polio     Past Surgical History:  Procedure Laterality Date   BONY PELVIS SURGERY     FEMUR SURGERY     KNEE SURGERY      Family History  Family History  Problem Relation Age of Onset   Cancer Mother    Heart attack Father  Social History   reports that he has quit smoking. He has never used smokeless tobacco. No history on file for alcohol use and drug use. 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 He is married, and lives with his wife in [place], [place]. He does not drink alcohol.   Allergies Allergies  Allergen Reactions   Penicillin G Rash    Prior to Admission medications   Medication Sig Start Date End Date Taking? Authorizing Provider  ASPIRIN 81 PO Take 81 mg by mouth in the  morning.   Yes [provider]  aspirin EC 325 MG tablet Take 325 mg by mouth as needed (pain).   Yes [provider]  doxycycline (VIBRA-TABS) 100 MG tablet Take 100 mg by mouth 2 (two) times a week. Wednesday and Sunday  For rosacea 06/14/18  Yes [provider]  lisinopril (PRINIVIL,ZESTRIL) 20 MG tablet Take 20 mg by mouth in the morning. 04/16/14  Yes [provider]  rosuvastatin (CRESTOR) 20 MG tablet Take 20 mg by mouth in the morning. 12/04/19  Yes [provider]    Physical Exam: Vitals:   05/03/23 0506 05/03/23 0507 05/03/23 0525 05/03/23 0526  BP:  (!) 148/70 (!) 124/58 126/62  Pulse:  66 65 63  Resp:  19 (!) 21 19  Temp:      TempSrc:      SpO2:  98% 97% 94%  Weight: 81 kg     Height: 5\' 7"  (1.702 m)       Constitutional: NAD, calm, comfortable Eyes: PERRL, lids and conjunctivae normal ENMT: Mucous membranes are dry. Normal dentition.  Respiratory: clear to auscultation bilaterally, no wheezing, no crackles. Normal respiratory effort. No accessory muscle use.  Cardiovascular: Regular rate and rhythm, no murmurs / rubs / gallops. Trace edema L LE, no edema R LE  Abdomen: Mildly tender to palpation across epigastrium - no rebound. No hepatosplenomegaly. Bowel sounds positive. Not distended. Soft.  Musculoskeletal: No clubbing / cyanosis. No joint deformity upper and lower extremities. No contractures. Normal muscle tone. R LE slightly less developed/smaller in size than L LE Skin: No rashes, lesions, ulcers.  Neurologic: CN 2-12 grossly intact B. Sensation intact.  Psychiatric: Normal judgment and insight. Alert and oriented x 3. Normal mood.    Labs on Admission:   CBC: Recent Labs  Lab 05/03/23 0543 05/03/23 0554  WBC 9.6  --   NEUTROABS 6.9  --   HGB 14.1 13.9  HCT 42.0 41.0  MCV 98.6  --   PLT 160  --    Basic Metabolic Panel: Recent Labs  Lab 05/03/23 0543 05/03/23 0554  NA 138 140  K 3.8 3.8  CL 105 106   CO2 22  --   GLUCOSE 170* 168*  BUN 20 21  CREATININE 0.86 0.80  CALCIUM 8.6*  --    GFR: Estimated Creatinine Clearance: 78.9 mL/min (by C-G formula based on SCr of 0.8 mg/dL). Liver Function Tests:  Recent Labs  Lab 05/03/23 0543  AST 59*  ALT 32  ALKPHOS 63  BILITOT 1.0  PROT 6.9  ALBUMIN 3.4*   Recent Labs  Lab 05/03/23 0543  LIPASE 2,378*    Radiological Exams on Admission: CT Angio Chest/Abd/Pel for Dissection W and/or Wo Contrast  Result Date: 05/03/2023 CLINICAL DATA:  chest pain suspicious for acute aortic syndrome. EXAM: CT ANGIOGRAPHY CHEST, ABDOMEN AND PELVIS  IMPRESSION: 1. Aortic and coronary artery atherosclerosis without evidence of acute aortic  syndrome. 2. Prominent pulmonary trunk 3.4 cm, previously 2.7 cm, could be due to pulmonary arterial hypertension. No central embolus is seen on this nondedicated study. 3. Trace pleural effusions with mildly distended superior pulmonary veins. No overt edema. 4. Subpleural reticulation with an upper zonal predominance, subpleural bronchiolectasis and slight pleural thickening in the lung apices. 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 No active infiltrates are seen. 5. Two nodules in the base of the right upper lobe measuring 6 mm and 5 mm. Per Fleischner Society Guidelines, recommend a non-contrast Chest CT at 3-6 months, then consider another non-contrast Chest CT at 18-24 months. If patient is low risk for malignancy, non-contrast Chest CT at 18-24 months is optional. These guidelines do not apply to immunocompromised patients and patients with cancer. Follow up in patients with significant comorbidities as clinically warranted. For lung cancer screening, adhere to Lung-RADS guidelines. Reference: Radiology. 2017; 284(1):228-43. 6. 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 7. Aortoiliac nonstenosing calcific plaques, with the left iliac arteries about 50%  larger than the right, etiology unclear but there is evidence of old healed fractures of the right pelvis and proximal right femur as well. 8. CT findings of acute interstitial proximal pancreatitis with trace peripancreatic fluid. 9. Cholelithiasis without evidence of acute cholecystitis. 10. Cystitis versus bladder nondistention. 11. Constipation and diverticulosis. Electronically Signed   By: Almira Bar M.D.   On: 05/03/2023 06:55   DG Chest Portable 1 View  Result Date: 05/03/2023 CLINICAL DATA:  77 year old male with history of chest pain. EXAM: PORTABLE CHEST 1 VIEW  IMPRESSION: 1. Patchy areas of interstitial prominence and peribronchial cuffing, which may suggest an acute bronchitis, potentially with developing bronchopneumonia, most evident in the left mid to lower lung. Followup PA and lateral chest X-ray is recommended in 3-4 weeks following trial of antibiotic therapy to ensure resolution and exclude underlying malignancy. 2. Probable chronic scarring in the apex of the right upper lobe. Attention at time of follow-up chest x-ray is recommended to ensure stability. Electronically Signed   By: Trudie Reed M.D.   On: 05/03/2023 05:57     Lonia Blood, MD [facility] Office  [phone] Pager - Text Page per Amion as per below:  On-Call/Text Page:      [email or web address]  If 7PM-7AM, please contact night-coverage [email or web address] 05/03/2023, 9:12 AM

## 2023-05-03 NOTE — Discharge Instructions (Addendum)
Walk and hydrate!

## 2023-05-03 NOTE — ED Notes (Signed)
ED TO INPATIENT HANDOFF REPORT  ED Nurse Name and Phone #: Angelica Chessman 25  S Name/Age/Gender Edward Frazier 77 y.o. male Room/Bed: 052C/052C  Code Status   Code Status: Full Code  Home/SNF/Other Home Patient oriented to: self, place, time, and situation Is this baseline? Yes   Triage Complete: Triage complete  Chief Complaint Acute pancreatitis [K85.90]  Triage Note Patient from home  BIB [facility] w/ complaints of initial chest pain that started 30 minutes prior to EMS arrival. Patient states this has since resolved but moreso feels like its in the middle back now. Unable to describe the discomfort but just states he can't get comfortable. EMS reports he was diaphoretic  and pale on arrival. Patient self administered 325 of ASA PTA and EMS gave one SL nitroglycerin with no relief. EMS reports initially patients HR was in the 120s but suddenly dropped to 60. VSS per EMS. 18 g LAC.    Allergies Allergies  Allergen Reactions   Penicillin G Rash    Level of Care/Admitting Diagnosis ED Disposition     ED Disposition  Admit   Condition  --   Comment  Hospital Area: [facility] [[id number]]  Level of Care: Med-Surg [16]  May place patient in observation at [facility] or [facility] if equivalent level of care is available:: No  Covid Evaluation: Asymptomatic - no recent exposure (last 10 days) testing not required  Diagnosis: Acute pancreatitis [577.0.ICD-9-CM]  Admitting Physician: Tyrone Nine [[id number]]  Attending Physician: Tyrone Nine [phone]          B Medical/Surgery History Past Medical History:  Diagnosis Date   Hypertension    Polio    Past Surgical History:  Procedure Laterality Date   BONY PELVIS SURGERY     FEMUR SURGERY     KNEE SURGERY       A IV Location/Drains/Wounds Patient Lines/Drains/Airways Status     Active Line/Drains/Airways     Name Placement date Placement time Site Days   Peripheral IV 05/03/23 18 G 1.16" Left;Posterior  Hand 05/03/23  0513  Hand  less than 1   Peripheral IV 05/03/23 20 G 1" Right Antecubital 05/03/23  0734  Antecubital  less than 1            Intake/Output Last 24 hours  Intake/Output Summary (Last 24 hours) at 05/03/2023 1014 Last data filed at 05/03/2023 0920 Gross per 24 hour  Intake 2001.14 ml  Output --  Net 2001.14 ml    Labs/Imaging Results for orders placed or performed during the hospital encounter of 05/03/23 (from the past 48 hour(s))  CBC with Differential     Status: None   Collection Time: 05/03/23  5:43 AM  Result Value Ref Range   WBC 9.6 4.0 - 10.5 K/uL   RBC 4.26 4.22 - 5.81 MIL/uL   Hemoglobin 14.1 13.0 - 17.0 g/dL   HCT 95.2 84.1 - 32.4 %   MCV 98.6 80.0 - 100.0 fL   MCH 33.1 26.0 - 34.0 pg   MCHC 33.6 30.0 - 36.0 g/dL   RDW 40.1 02.7 - 25.3 %   Platelets 160 150 - 400 K/uL   nRBC 0.0 0.0 - 0.2 %   Neutrophils Relative % 72 %   Neutro Abs 6.9 1.7 - 7.7 K/uL   Lymphocytes Relative 21 %   Lymphs Abs 2.0 0.7 - 4.0 K/uL   Monocytes Relative 5 %   Monocytes Absolute 0.4 0.1 - 1.0 K/uL  Eosinophils Relative 2 %   Eosinophils Absolute 0.2 0.0 - 0.5 K/uL   Basophils Relative 0 %   Basophils Absolute 0.0 0.0 - 0.1 K/uL   Immature Granulocytes 0 %   Abs Immature Granulocytes 0.03 0.00 - 0.07 K/uL    Comment: Performed at [facility], [address], [place], [place] [address]  Comprehensive metabolic panel     Status: Abnormal   Collection Time: 05/03/23  5:43 AM  Result Value Ref Range   Sodium 138 135 - 145 mmol/L   Potassium 3.8 3.5 - 5.1 mmol/L   Chloride 105 98 - 111 mmol/L   CO2 22 22 - 32 mmol/L   Glucose, Bld 170 (H) 70 - 99 mg/dL    Comment: Glucose reference range applies only to samples taken after fasting for at least 8 hours.   BUN 20 8 - 23 mg/dL   Creatinine, Ser 6.04 0.61 - 1.24 mg/dL   Calcium 8.6 (L) 8.9 - 10.3 mg/dL   Total Protein 6.9 6.5 - 8.1 g/dL   Albumin 3.4 (L) 3.5 - 5.0 g/dL   AST 59 (H) 15 - 41 U/L   ALT 32 0 -  44 U/L   Alkaline Phosphatase 63 38 - 126 U/L   Total Bilirubin 1.0 0.3 - 1.2 mg/dL   GFR, Estimated >54 >09 mL/min    Comment: (NOTE) Calculated using the CKD-EPI Creatinine Equation (2021)    Anion gap 11 5 - 15    Comment: Performed at [facility], [address], [place], [place] [address]  Lipase, blood     Status: Abnormal   Collection Time: 05/03/23  5:43 AM  Result Value Ref Range   Lipase 2,378 (H) 11 - 51 U/L    Comment: RESULTS CONFIRMED BY MANUAL DILUTION Performed at [facility], [address] [place], [place] [address]   Troponin I (High Sensitivity)     Status: None   Collection Time: 05/03/23  5:43 AM  Result Value Ref Range   Troponin I (High Sensitivity) 10 <18 ng/L    Comment: (NOTE) Elevated high sensitivity troponin I (hsTnI) values and significant  changes across serial measurements may suggest ACS but many other  chronic and acute conditions are known to elevate hsTnI results.  Refer to the "Links" section for chest pain algorithms and additional  guidance. Performed at [facility], [address] [place], [place] [address]   D-dimer, quantitative     Status: Abnormal   Collection Time: 05/03/23  5:43 AM  Result Value Ref Range   D-Dimer, Quant 0.61 (H) 0.00 - 0.50 ug/mL-FEU    Comment: (NOTE) At the manufacturer cut-off value of 0.5 g/mL FEU, this assay has a negative predictive value of 95-100%.This assay is intended for use in conjunction with a clinical pretest probability (PTP) assessment model to exclude pulmonary embolism (PE) and deep venous thrombosis (DVT) in outpatients suspected of PE or DVT. Results should be correlated with clinical presentation. Performed at [facility], [address], [place], [place] [address]   I-stat chem 8, ED (not at [facility], [facility] or [facility])     Status: Abnormal   Collection Time: 05/03/23  5:54 AM  Result Value Ref Range   Sodium 140 135 - 145 mmol/L   Potassium 3.8 3.5 - 5.1 mmol/L    Chloride 106 98 - 111 mmol/L   BUN 21 8 - 23 mg/dL   Creatinine, Ser 8.46 0.61 - 1.24  mg/dL   Glucose, Bld 409 (H) 70 - 99 mg/dL    Comment: Glucose reference range applies only to samples taken after fasting for at least 8 hours.   Calcium, Ion 1.10 (L) 1.15 - 1.40 mmol/L   TCO2 23 22 - 32 mmol/L   Hemoglobin 13.9 13.0 - 17.0 g/dL   HCT 81.1 91.4 - 78.2 %   CT Angio Chest/Abd/Pel for Dissection W and/or Wo Contrast  Result Date: 05/03/2023 CLINICAL DATA:  chest pain suspicious for acute aortic syndrome. EXAM: CT ANGIOGRAPHY CHEST, ABDOMEN AND PELVIS TECHNIQUE: Non-contrast CT of the chest was Initially obtained. Multidetector CT imaging through the chest, abdomen and pelvis was performed using the standard protocol during bolus administration of intravenous contrast. Multiplanar reconstructed images and MIPs were obtained and reviewed to evaluate the vascular anatomy. RADIATION DOSE REDUCTION: This exam was performed according to the departmental dose-optimization program which includes automated exposure control, adjustment of the mA and/or kV according to patient size and/or use of iterative reconstruction technique. CONTRAST:  75mL OMNIPAQUE IOHEXOL 350 MG/ML SOLN COMPARISON:  Portable chest today, and partial chest CT for coronary artery calcium scoring dated 12/03/2019. No other cross-sectional imaging for comparison. FINDINGS: CTA CHEST FINDINGS Cardiovascular: The cardiac size is normal. There calcifications in the left main, lad, and circumflex and proximal right coronary arteries, heaviest in the left main and proximal LAD. There is no pericardial effusion. There are mildly distended superior pulmonary veins. The pulmonary trunk is prominent 3.4 cm indicating arterial hypertension, was previously 2.7 cm. Pulmonary arteries are centrally clear. There is mild aortic tortuosity. There is no aortic aneurysm, dissection, hematoma or stenosis. There is mild patchy calcific plaque in the arch and  descending aorta, scattered calcifications in the normal caliber great vessels. Mediastinum/Nodes: There is a mildly patulous esophagus with increased wall thickening distally. Probable esophagitis. Endoscopy may be indicated to exclude Barrett's esophagus. There is a small hiatal hernia. The thyroid gland is unremarkable. Axillary spaces are clear. There is no intrathoracic adenopathy. Mild mediastinal lipomatosis. Lungs/Pleura: There are trace pleural effusions. There is no pneumothorax. There is subpleural reticulation bilaterally with an upper zonal predominance, in the lung apices associated with subpleural bronchiolectasis and slight pleural thickening. There are diffuse bronchial thickening without visible bronchial plugging or bronchiectasis. Mosaicism is noted in the lower lobes and lung apices most likely due to air trapping and small airways disease. No active infiltrates are seen. There are 2 nodules in the base of the right upper lobe anteriorly measuring 6 mm and 5 mm, both visible on 7:71. There are no other appreciable nodules. Musculoskeletal: Mild thoracic kyphosis. There is osteopenia with degenerative changes and bridging enthesopathy of the thoracic spine. There is mild anterior wedging of the T4, T6, T7, and T8 vertebral bodies all with a chronic appearance. On the right there is acromiohumeral abutment consistent with chronic rotator cuff arthropathy. No concerning regional bone lesion is seen. The ribcage is intact. The chest wall is unremarkable. Review of the MIP images confirms the above findings. CTA ABDOMEN AND PELVIS FINDINGS VASCULAR Aorta: There is mild-to-moderate patchy calcific plaque without no aneurysm, dissection or stenosis, or penetrating ulcer. Celiac: There is an 80% vessel origin stenosis in part due to ostial calcific plaques and in part due to compression by the median arcuate ligament of the diaphragm. The vessel otherwise opacifies well with no branch occlusions. SMA:  There are nonstenosing ostial calcific plaques over the top of the vessel origin. There is no flow-limiting stenosis, aneurysm or  dissection, or branch occlusion. Renals: Both pulmonary arteries are single. Both demonstrate fairly heavy plaques in the proximal 1 cm. On the right this causes a 60% vessel origin stenosis. Right renal artery otherwise opacifies well with no branch occlusions. On the left the calcifications cause a 50% stenosis a few mm distal to the vessel origin. The rest of the left renal artery and branches opacify well. IMA: Patent without evidence of aneurysm, dissection, vasculitis or significant stenosis. Inflow: There are moderate calcifications in the common iliac arteries, bilaterally are nonstenosing. The left common iliac, internal and external iliac arteries are all nearly 50% larger than their right sided counterpart arteries, etiology indeterminate. There are scattered calcifications in the bilateral internal iliac arteries but no flow-limiting stenosis. No stenosis of the external iliac arteries is seen or significant plaques. Veins: Unopacified and not evaluated. Review of the MIP images confirms the above findings. NON-VASCULAR Hepatobiliary: There is streak artifact through the liver due to the patient's arms in the field. No obvious mass is seen. Gallbladder is mildly distended. There is a single subcentimeter calcified stone in the proximal lumen. No biliary dilatation. Pancreas: There is edema in and around the pancreatic head, uncinate process and neck. Findings consistent with acute interstitial pancreatitis. There is trace nonlocalizing peripancreatic fluid extending into the pancreaticoduodenal groove. There is no pancreatic hemorrhage, abscess, mass enhancement or ductal dilatation. Spleen: No abnormality is seen through the streak artifacts. Adrenals/Urinary Tract: Adrenal glands are unremarkable. Kidneys are normal, without renal calculi, focal lesion, or hydronephrosis.  The bladder is mildly thickened but is not fully distended. There is no masslike thickening. Stomach/Bowel: No dilatation or wall thickening including the appendix. Moderate fecal stasis. There is sigmoid diverticulosis without evidence of acute diverticulitis. Lymphatic: No lymphadenopathy is seen. Reproductive: There is no prostatomegaly. Both testicles are in the scrotal sac. Other: No abdominal wall hernia or abnormality. No abdominopelvic ascites. Musculoskeletal: There are chronic healed fracture deformities of the right hemipelvis and proximal right femur, with an intramedullary rod in the right femur partially imaged, with a dynamic compression screw in the right femoral neck and head. 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 There is ankylosis over the right SI joint. No destructive osseous lesions. Degenerative change lumbar spine. Review of the MIP images confirms the above findings. IMPRESSION: 1. Aortic and coronary artery atherosclerosis without evidence of acute aortic syndrome. 2. Prominent pulmonary trunk 3.4 cm, previously 2.7 cm, could be due to pulmonary arterial hypertension. No central embolus is seen on this nondedicated study. 3. Trace pleural effusions with mildly distended superior pulmonary veins. No overt edema. 4. Subpleural reticulation with an upper zonal predominance, subpleural bronchiolectasis and slight pleural thickening in the lung apices. 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 No active infiltrates are seen. 5. Two nodules in the base of the right upper lobe measuring 6 mm and 5 mm. Per Fleischner Society Guidelines, recommend a non-contrast Chest CT at 3-6 months, then consider another non-contrast Chest CT at 18-24 months. If patient is low risk for malignancy, non-contrast Chest CT at 18-24 months is optional. These guidelines do not apply to immunocompromised patients  and patients with cancer. Follow up in patients with significant comorbidities as clinically warranted. For lung cancer screening, adhere to Lung-RADS guidelines. Reference: Radiology. 2017; 284(1):228-43. 6. 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 7. Aortoiliac nonstenosing calcific plaques, with the  left iliac arteries about 50% larger than the right, etiology unclear but there is evidence of old healed fractures of the right pelvis and proximal right femur as well. 8. CT findings of acute interstitial proximal pancreatitis with trace peripancreatic fluid. 9. Cholelithiasis without evidence of acute cholecystitis. 10. Cystitis versus bladder nondistention. 11. Constipation and diverticulosis. Electronically Signed   By: Almira Bar M.D.   On: 05/03/2023 06:55   DG Chest Portable 1 View  Result Date: 05/03/2023 CLINICAL DATA:  77 year old male with history of chest pain. EXAM: PORTABLE CHEST 1 VIEW COMPARISON:  No priors. FINDINGS: Lung volumes are low. Scattered areas of coarse interstitial markings are noted throughout the lungs bilaterally, most apparent in the left mid to lower lung where there is also some peribronchial cuffing, and in the right upper lobe near the apex where there is associated pleural-parenchymal thickening, which could reflect chronic scarring (no prior studies are available for comparison). No confluent consolidative airspace disease. No pleural effusions. No pneumothorax. No evidence of pulmonary edema. Heart size is normal. The patient is rotated to the right on today's exam, resulting in distortion of the mediastinal contours and reduced diagnostic sensitivity and specificity for mediastinal pathology. IMPRESSION: 1. Patchy areas of interstitial prominence and peribronchial cuffing, which may suggest an acute bronchitis, potentially with developing bronchopneumonia, most evident in the left mid to lower lung. Followup PA and  lateral chest X-ray is recommended in 3-4 weeks following trial of antibiotic therapy to ensure resolution and exclude underlying malignancy. 2. Probable chronic scarring in the apex of the right upper lobe. Attention at time of follow-up chest x-ray is recommended to ensure stability. Electronically Signed   By: Trudie Reed M.D.   On: 05/03/2023 05:57    Pending Labs Unresulted Labs (From admission, onward)     Start     Ordered   05/04/23 0500  Comprehensive metabolic panel  Tomorrow morning,   R        05/03/23 0923   05/04/23 0500  CBC  Tomorrow morning,   R        05/03/23 0923   05/04/23 0500  Lipid panel  Tomorrow morning,   R        05/03/23 0924   05/04/23 0500  Lipase, blood  Tomorrow morning,   R        05/03/23 0953            Vitals/Pain Today's Vitals   05/03/23 0525 05/03/23 0526 05/03/23 0924 05/03/23 0930  BP: (!) 124/58 126/62  (!) 152/80  Pulse: 65 63  76  Resp: (!) 21 19  18   Temp:    97.7 F (36.5 C)  TempSrc:    Oral  SpO2: 97% 94%  95%  Weight:      Height:      PainSc:   7      Isolation Precautions No active isolations  Medications Medications  enoxaparin (LOVENOX) injection 40 mg (has no administration in time range)  dextrose 5% in lactated ringers with KCl 20 mEq/L infusion (has no administration in time range)  acetaminophen (TYLENOL) tablet 650 mg (has no administration in time range)  oxyCODONE (Oxy IR/ROXICODONE) immediate release tablet 5 mg (has no administration in time range)  morphine (PF) 2 MG/ML injection 2 mg (has no administration in time range)  ondansetron (ZOFRAN) tablet 4 mg (has no administration in time range)    Or  ondansetron (ZOFRAN) injection 4 mg (has no administration in time range)  aspirin chewable tablet 81 mg (has no administration in time range)  doxycycline (VIBRA-TABS) tablet 100 mg (has no administration in time range)  lisinopril (ZESTRIL) tablet 20 mg (has no administration in time range)  iohexol  (OMNIPAQUE) 350 MG/ML injection 75 mL (75 mLs Intravenous Contrast Given 05/03/23 0603)  lactated ringers bolus 1,000 mL ( Intravenous Stopped 05/03/23 0850)  fentaNYL (SUBLIMAZE) injection 50 mcg (50 mcg Intravenous Given 05/03/23 0737)  ondansetron (ZOFRAN) injection 4 mg (4 mg Intravenous Given 05/03/23 0735)  lactated ringers bolus 1,000 mL ( Intravenous Stopped 05/03/23 0850)  morphine (PF) 4 MG/ML injection 4 mg (4 mg Intravenous Given 05/03/23 0925)    Mobility walks     Focused Assessments GI, acute pancreatitis   R Recommendations: See Admitting Provider Note  Report given to:   Additional Notes: PT is AOX4, family at bedside, they moved him to purple [place], with all the psychs and I've had him for 5 minutes.Marland KitchenMarland Kitchen

## 2023-05-03 NOTE — ED Provider Notes (Signed)
[facility] EMERG[facility]Y DEPARTMENT AT [facility] Provider Note   CSN: [id number] Arrival date & time: 05/03/23  0457     History  Chief Complaint  Patient presents with   Chest Pain   Back Pain    Edward Frazier is a 77 y.o. male.  Patient with a history of postpolio syndrome, hypertension, hyperlipidemia presenting with mid back pain that woke her from sleep about 3:30 AM.  States he went to bed feeling well.  He woke up with a "discomfort" to his right upper back between his shoulder blades that has been constant.  He thought he might of slept wrong but the pain is not improving.  Wife reports he was very pale and diaphoretic.  This lasted about 30 minutes and the paleness resolved but the pain in the back persists.  Denies any chest pain contrary to triage note.  Pain has not moved since it first started.  No associated shortness of breath, cough, fever, nausea, vomiting Denies any cardiac history.  Reports negative stress test last year States she still does not feel right as there is some discomfort in his upper back which she has difficulty describing it has having trouble getting comfortable.  Paleness and diaphoresis have resolved.  Did receive aspirin nitroglycerin from EMS without improvement.  Was tachycardic initially but then became sinus rhythm in the 60s for EMS.  Denies abdominal pain or low back pain.  Denies dizziness or lightheadedness  The history is provided by the patient, the EMS personnel and the spouse.  Chest Pain Associated symptoms: back pain   Associated symptoms: no abdominal pain, no cough, no dizziness, no fever, no headache, no nausea, no shortness of breath, no vomiting and no weakness   Back Pain Associated symptoms: chest pain   Associated symptoms: no abdominal pain, no dysuria, no fever, no headaches and no weakness        Home Medications Prior to Admission medications   Medication Sig Start Date End Date Taking? Authorizing Provider   ASPIRIN 81 PO Take by mouth daily.    [provider]  doxycycline (VIBRA-TABS) 100 MG tablet takes PRN for rosacea 06/14/18   [provider]  lisinopril (PRINIVIL,ZESTRIL) 20 MG tablet  04/16/14   [provider]  rosuvastatin (CRESTOR) 20 MG tablet  12/04/19   [provider]      Allergies    Penicillin g and Penicillins    Review of Systems   Review of Systems  Constitutional:  Negative for activity change, appetite change and fever.  HENT:  Negative for congestion and rhinorrhea.   Respiratory:  Negative for cough, chest tightness and shortness of breath.   Cardiovascular:  Positive for chest pain.  Gastrointestinal:  Negative for abdominal pain, nausea and vomiting.  Genitourinary:  Negative for dysuria and hematuria.  Musculoskeletal:  Positive for back pain.  Skin:  Negative for rash.  Neurological:  Negative for dizziness, weakness and headaches.   all other systems are negative except as noted in the HPI and PMH.    Physical Exam Updated Vital Signs BP 126/62 (BP Location: Left Arm)   Pulse 63   Temp 97.6 F (36.4 C) (Oral)   Resp 19   Ht 5\' 7"  (1.702 m)   Wt 81 kg   SpO2 94%   BMI 27.97 kg/m  Physical Exam Vitals and nursing note reviewed.  Constitutional:      General: He is not in acute distress.    Appearance: Normal  appearance. He is well-developed. He is obese.  HENT:     Head: Normocephalic and atraumatic.     Mouth/Throat:     Pharynx: No oropharyngeal exudate.  Eyes:     Conjunctiva/sclera: Conjunctivae normal.     Pupils: Pupils are equal, round, and reactive to light.  Neck:     Comments: No meningismus. Cardiovascular:     Rate and Rhythm: Normal rate and regular rhythm.     Heart sounds: Normal heart sounds. No murmur heard.    Comments: Radial pulses and cardinal hand movements Pulmonary:     Effort: Pulmonary effort is normal. No respiratory distress.     Breath sounds: Normal breath sounds.  Chest:      Chest wall: No tenderness.  Abdominal:     Palpations: Abdomen is soft.     Tenderness: There is no abdominal tenderness. There is no guarding or rebound.  Musculoskeletal:        General: No tenderness. Normal range of motion.     Cervical back: Normal range of motion and neck supple.  Skin:    General: Skin is warm.     Capillary Refill: Capillary refill takes less than 2 seconds.  Neurological:     General: No focal deficit present.     Mental Status: He is alert and oriented to person, place, and time. Mental status is at baseline.     Cranial Nerves: No cranial nerve deficit.     Motor: No abnormal muscle tone.     Coordination: Coordination normal.     Comments:  5/5 strength throughout. CN 2-12 intact.Equal grip strength.   Psychiatric:        Behavior: Behavior normal.     ED Results / Procedures / Treatments   Labs (all labs ordered are listed, but only abnormal results are displayed) Labs Reviewed  COMPREHENSIVE METABOLIC PANEL - Abnormal; Notable for the following components:      Result Value   Glucose, Bld 170 (*)    Calcium 8.6 (*)    Albumin 3.4 (*)    AST 59 (*)    All other components within normal limits  LIPASE, BLOOD - Abnormal; Notable for the following components:   Lipase 2,378 (*)    All other components within normal limits  D-DIMER, QUANTITATIVE - Abnormal; Notable for the following components:   D-Dimer, Quant 0.61 (*)    All other components within normal limits  I-STAT CHEM 8, ED - Abnormal; Notable for the following components:   Glucose, Bld 168 (*)    Calcium, Ion 1.10 (*)    All other components within normal limits  CBC WITH DIFFERENTIAL/PLATELET  TROPONIN I (HIGH SENSITIVITY)  TROPONIN I (HIGH SENSITIVITY)    EKG EKG Interpretation Date/Time:  Tuesday May 03 2023 05:02:27 EDT Ventricular Rate:  69 PR Interval:  170 QRS Duration:  104 QT Interval:  389 QTC Calculation: 417 R Axis:   21  Text Interpretation: Sinus rhythm  Abnormal R-wave progression, early transition No previous ECGs available Confirmed by Glynn Octave [phone]) on 05/03/2023 5:09:56 AM  Radiology CT Angio Chest/Abd/Pel for Dissection W and/or Wo Contrast  Result Date: 05/03/2023 CLINICAL DATA:  chest pain suspicious for acute aortic syndrome. EXAM: CT ANGIOGRAPHY CHEST, ABDOMEN AND PELVIS TECHNIQUE: Non-contrast CT of the chest was Initially obtained. Multidetector CT imaging through the chest, abdomen and pelvis was performed using the standard protocol during bolus administration of intravenous contrast. Multiplanar reconstructed images and MIPs were obtained and reviewed to evaluate  the vascular anatomy. RADIATION DOSE REDUCTION: This exam was performed according to the departmental dose-optimization program which includes automated exposure control, adjustment of the mA and/or kV according to patient size and/or use of iterative reconstruction technique. CONTRAST:  75mL OMNIPAQUE IOHEXOL 350 MG/ML SOLN COMPARISON:  Portable chest today, and partial chest CT for coronary artery calcium scoring dated 12/03/2019. No other cross-sectional imaging for comparison. FINDINGS: CTA CHEST FINDINGS Cardiovascular: The cardiac size is normal. There calcifications in the left main, lad, and circumflex and proximal right coronary arteries, heaviest in the left main and proximal LAD. There is no pericardial effusion. There are mildly distended superior pulmonary veins. The pulmonary trunk is prominent 3.4 cm indicating arterial hypertension, was previously 2.7 cm. Pulmonary arteries are centrally clear. There is mild aortic tortuosity. There is no aortic aneurysm, dissection, hematoma or stenosis. There is mild patchy calcific plaque in the arch and descending aorta, scattered calcifications in the normal caliber great vessels. Mediastinum/Nodes: There is a mildly patulous esophagus with increased wall thickening distally. Probable esophagitis. Endoscopy may be indicated  to exclude Barrett's esophagus. There is a small hiatal hernia. The thyroid gland is unremarkable. Axillary spaces are clear. There is no intrathoracic adenopathy. Mild mediastinal lipomatosis. Lungs/Pleura: There are trace pleural effusions. There is no pneumothorax. There is subpleural reticulation bilaterally with an upper zonal predominance, in the lung apices associated with subpleural bronchiolectasis and slight pleural thickening. There are diffuse bronchial thickening without visible bronchial plugging or bronchiectasis. Mosaicism is noted in the lower lobes and lung apices most likely due to air trapping and small airways disease. No active infiltrates are seen. There are 2 nodules in the base of the right upper lobe anteriorly measuring 6 mm and 5 mm, both visible on 7:71. There are no other appreciable nodules. Musculoskeletal: Mild thoracic kyphosis. There is osteopenia with degenerative changes and bridging enthesopathy of the thoracic spine. There is mild anterior wedging of the T4, T6, T7, and T8 vertebral bodies all with a chronic appearance. On the right there is acromiohumeral abutment consistent with chronic rotator cuff arthropathy. No concerning regional bone lesion is seen. The ribcage is intact. The chest wall is unremarkable. Review of the MIP images confirms the above findings. CTA ABDOMEN AND PELVIS FINDINGS VASCULAR Aorta: There is mild-to-moderate patchy calcific plaque without no aneurysm, dissection or stenosis, or penetrating ulcer. Celiac: There is an 80% vessel origin stenosis in part due to ostial calcific plaques and in part due to compression by the median arcuate ligament of the diaphragm. The vessel otherwise opacifies well with no branch occlusions. SMA: There are nonstenosing ostial calcific plaques over the top of the vessel origin. There is no flow-limiting stenosis, aneurysm or dissection, or branch occlusion. Renals: Both pulmonary arteries are single. Both demonstrate  fairly heavy plaques in the proximal 1 cm. On the right this causes a 60% vessel origin stenosis. Right renal artery otherwise opacifies well with no branch occlusions. On the left the calcifications cause a 50% stenosis a few mm distal to the vessel origin. The rest of the left renal artery and branches opacify well. IMA: Patent without evidence of aneurysm, dissection, vasculitis or significant stenosis. Inflow: There are moderate calcifications in the common iliac arteries, bilaterally are nonstenosing. The left common iliac, internal and external iliac arteries are all nearly 50% larger than their right sided counterpart arteries, etiology indeterminate. There are scattered calcifications in the bilateral internal iliac arteries but no flow-limiting stenosis. No stenosis of the external iliac arteries is seen  or significant plaques. Veins: Unopacified and not evaluated. Review of the MIP images confirms the above findings. NON-VASCULAR Hepatobiliary: There is streak artifact through the liver due to the patient's arms in the field. No obvious mass is seen. Gallbladder is mildly distended. There is a single subcentimeter calcified stone in the proximal lumen. No biliary dilatation. Pancreas: There is edema in and around the pancreatic head, uncinate process and neck. Findings consistent with acute interstitial pancreatitis. There is trace nonlocalizing peripancreatic fluid extending into the pancreaticoduodenal groove. There is no pancreatic hemorrhage, abscess, mass enhancement or ductal dilatation. Spleen: No abnormality is seen through the streak artifacts. Adrenals/Urinary Tract: Adrenal glands are unremarkable. Kidneys are normal, without renal calculi, focal lesion, or hydronephrosis. The bladder is mildly thickened but is not fully distended. There is no masslike thickening. Stomach/Bowel: No dilatation or wall thickening including the appendix. Moderate fecal stasis. There is sigmoid diverticulosis  without evidence of acute diverticulitis. Lymphatic: No lymphadenopathy is seen. Reproductive: There is no prostatomegaly. Both testicles are in the scrotal sac. Other: No abdominal wall hernia or abnormality. No abdominopelvic ascites. Musculoskeletal: There are chronic healed fracture deformities of the right hemipelvis and proximal right femur, with an intramedullary rod in the right femur partially imaged, with a dynamic compression screw in the right femoral neck and head. 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 There is ankylosis over the right SI joint. No destructive osseous lesions. Degenerative change lumbar spine. Review of the MIP images confirms the above findings. IMPRESSION: 1. Aortic and coronary artery atherosclerosis without evidence of acute aortic syndrome. 2. Prominent pulmonary trunk 3.4 cm, previously 2.7 cm, could be due to pulmonary arterial hypertension. No central embolus is seen on this nondedicated study. 3. Trace pleural effusions with mildly distended superior pulmonary veins. No overt edema. 4. Subpleural reticulation with an upper zonal predominance, subpleural bronchiolectasis and slight pleural thickening in the lung apices. 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 No active infiltrates are seen. 5. Two nodules in the base of the right upper lobe measuring 6 mm and 5 mm. Per Fleischner Society Guidelines, recommend a non-contrast Chest CT at 3-6 months, then consider another non-contrast Chest CT at 18-24 months. If patient is low risk for malignancy, non-contrast Chest CT at 18-24 months is optional. These guidelines do not apply to immunocompromised patients and patients with cancer. Follow up in patients with significant comorbidities as clinically warranted. For lung cancer screening, adhere to Lung-RADS guidelines. Reference: Radiology. 2017; 284(1):228-43. 6. Origin  stenoses in the celiac trunk and right and left renal arteries as described above. 7. Aortoiliac nonstenosing calcific plaques, with the left iliac arteries about 50% larger than the right, etiology unclear but there is evidence of old healed fractures of the right pelvis and proximal right femur as well. 8. CT findings of acute interstitial proximal pancreatitis with trace peripancreatic fluid. 9. Cholelithiasis without evidence of acute cholecystitis. 10. Cystitis versus bladder nondistention. 11. Constipation and diverticulosis. Electronically Signed   By: Almira Bar M.D.   On: 05/03/2023 06:55   DG Chest Portable 1 View  Result Date: 05/03/2023 CLINICAL DATA:  77 year old male with history of chest pain. EXAM: PORTABLE CHEST 1 VIEW COMPARISON:  No priors. FINDINGS: Lung volumes are low. Scattered areas of coarse interstitial markings are noted throughout the lungs bilaterally, most apparent in the left mid to lower lung where there is also some peribronchial cuffing, and in the  right upper lobe near the apex where there is associated pleural-parenchymal thickening, which could reflect chronic scarring (no prior studies are available for comparison). No confluent consolidative airspace disease. No pleural effusions. No pneumothorax. No evidence of pulmonary edema. Heart size is normal. The patient is rotated to the right on today's exam, resulting in distortion of the mediastinal contours and reduced diagnostic sensitivity and specificity for mediastinal pathology. IMPRESSION: 1. Patchy areas of interstitial prominence and peribronchial cuffing, which may suggest an acute bronchitis, potentially with developing bronchopneumonia, most evident in the left mid to lower lung. Followup PA and lateral chest X-ray is recommended in 3-4 weeks following trial of antibiotic therapy to ensure resolution and exclude underlying malignancy. 2. Probable chronic scarring in the apex of the right upper lobe. Attention at  time of follow-up chest x-ray is recommended to ensure stability. Electronically Signed   By: Trudie Reed M.D.   On: 05/03/2023 05:57    Procedures .Critical Care  Performed by: Glynn Octave, MD Authorized by: Glynn Octave, MD   Critical care provider statement:    Critical care time (minutes):  35   Critical care time was exclusive of:  Separately billable procedures and treating other patients   Critical care was necessary to treat or prevent imminent or life-threatening deterioration of the following conditions: ACS rule out.   Critical care was time spent personally by me on the following activities:  Development of treatment plan with patient or surrogate, discussions with consultants, evaluation of patient's response to treatment, examination of patient, ordering and review of laboratory studies, ordering and review of radiographic studies, ordering and performing treatments and interventions, pulse oximetry, re-evaluation of patient's condition, review of old charts, blood draw for specimens and obtaining history from patient or surrogate   I assumed direction of critical care for this patient from another provider in my specialty: no     Care discussed with: admitting provider       Medications Ordered in ED Medications - No data to display  ED Course/ Medical Decision Making/ A&P                                 Medical Decision Making Amount and/or Complexity of Data Reviewed Labs: ordered. Decision-making details documented in ED Course. Radiology: ordered and independent interpretation performed. Decision-making details documented in ED Course. ECG/medicine tests: ordered and independent interpretation performed. Decision-making details documented in ED Course.  Risk Prescription drug management.   Sudden onset upper back pain that woke her from sleep with diaphoresis and paleness.  No chest pain.  EKG is sinus rhythm without acute ST changes.  Equal radial  pulses and grip strength.  Equal upper extremity blood pressures  Concern for possibly ACS, PE, aortic dissection.  Chest x-ray concerning for possible infiltrate but he denies any cough or shortness of breath or fever  Patient sent for emergent CT scan to exclude aortic aneurysm or dissection.  This shows no acute aortic pathology.  No large pulmonary embolism.  Does show multiple lung nodules which patient is made aware of and need for follow up in 6 months.  There is some stranding along the pancreas which may be contributing to his back pain. Cholelithiasis without evidence of cholecystitis.  Labs are pending.  No significant leukocytosis.  Lipase greater than 2000  Suspect patient's presentation likely secondary to pancreatitis.  Troponin remains negative without evidence of MI but will trend.  Pain has improved  IV fluids and pain medication pending at shift change.  Second troponin pending at shift change.  Patient will likely need admission for new onset pancreatitis and possible gallstone pancreatitis.  Denies abdominal pain.  Dr. Particia Nearing to assume care.         Final Clinical Impression(s) / ED Diagnoses Final diagnoses:  None    Rx / DC Orders ED Discharge Orders     None         Clemie General, Jeannett Senior, MD 05/03/23 [id number]

## 2023-05-04 ENCOUNTER — Inpatient Hospital Stay (HOSPITAL_COMMUNITY): Payer: Medicare PPO

## 2023-05-04 DIAGNOSIS — K859 Acute pancreatitis without necrosis or infection, unspecified: Secondary | ICD-10-CM

## 2023-05-04 DIAGNOSIS — R918 Other nonspecific abnormal finding of lung field: Secondary | ICD-10-CM | POA: Diagnosis not present

## 2023-05-04 DIAGNOSIS — I272 Pulmonary hypertension, unspecified: Secondary | ICD-10-CM | POA: Diagnosis not present

## 2023-05-04 LAB — MAGNESIUM: Magnesium: 1.7 mg/dL (ref 1.7–2.4)

## 2023-05-04 LAB — LIPID PANEL
Cholesterol: 87 mg/dL (ref 0–200)
HDL: 45 mg/dL (ref >40)
LDL Cholesterol: 35 mg/dL (ref 0–99)
Total CHOL/HDL Ratio: 1.9 RATIO
Triglycerides: 36 mg/dL (ref <150)
VLDL: 7 mg/dL (ref 0–40)

## 2023-05-04 LAB — COMPREHENSIVE METABOLIC PANEL
ALT: 53 U/L — ABNORMAL HIGH (ref 0–44)
AST: 55 U/L — ABNORMAL HIGH (ref 15–41)
Albumin: 3.1 g/dL — ABNORMAL LOW (ref 3.5–5.0)
Alkaline Phosphatase: 62 U/L (ref 38–126)
Anion gap: 8 (ref 5–15)
BUN: 10 mg/dL (ref 8–23)
CO2: 24 mmol/L (ref 22–32)
Calcium: 8.2 mg/dL — ABNORMAL LOW (ref 8.9–10.3)
Chloride: 102 mmol/L (ref 98–111)
Creatinine, Ser: 0.62 mg/dL (ref 0.61–1.24)
GFR, Estimated: >60 mL/min (ref >60)
Glucose, Bld: 135 mg/dL — ABNORMAL HIGH (ref 70–99)
Potassium: 4.1 mmol/L (ref 3.5–5.1)
Sodium: 134 mmol/L — ABNORMAL LOW (ref 135–145)
Total Bilirubin: 0.9 mg/dL (ref 0.3–1.2)
Total Protein: 6.3 g/dL — ABNORMAL LOW (ref 6.5–8.1)

## 2023-05-04 LAB — CBC
HCT: 41.8 % (ref 39.0–52.0)
Hemoglobin: 13.8 g/dL (ref 13.0–17.0)
MCH: 31.7 pg (ref 26.0–34.0)
MCHC: 33 g/dL (ref 30.0–36.0)
MCV: 95.9 fL (ref 80.0–100.0)
Platelets: 129 10*3/uL — ABNORMAL LOW (ref 150–400)
RBC: 4.36 MIL/uL (ref 4.22–5.81)
RDW: 12.9 % (ref 11.5–15.5)
WBC: 14.4 10*3/uL — ABNORMAL HIGH (ref 4.0–10.5)
nRBC: 0 % (ref 0.0–0.2)

## 2023-05-04 LAB — HEMOGLOBIN A1C
Hgb A1c MFr Bld: 5.6 % (ref 4.8–5.6)
Mean Plasma Glucose: 114.02 mg/dL

## 2023-05-04 LAB — LIPASE, BLOOD: Lipase: 613 U/L — ABNORMAL HIGH (ref 11–51)

## 2023-05-04 MED ORDER — POLYETHYLENE GLYCOL 3350 17 G PO PACK
17.0000 g | PACK | Freq: Two times a day (BID) | ORAL | Status: DC
  Administered 2023-05-04 – 2023-05-06 (×5): 17 g via ORAL
  Filled 2023-05-04 (×5): qty 1

## 2023-05-04 NOTE — TOC CM/SW Note (Signed)
Transition of Care [facility]) - Inpatient Brief Assessment   Patient Details  Name: BLAYZE YEBRA MRN: [id number] Date of Birth: [birth year]  Transition of [facility]) CM/SW Contact:    Harriet Masson, RN Phone Number: 05/04/2023, 1:54 PM   Clinical Narrative:  Patient admitted for Acute idiopathic pancreatitis. Patient receiving iv and po pain medications. TOC following.  Transition of Care Asessment: Insurance and Status: Insurance coverage has been reviewed Patient has primary care physician: Yes Home environment has been reviewed: safe to discharge home when medically ready Prior level of function:: independent Prior/Current Home Services: No current home services Social Determinants of Health Reivew: SDOH reviewed no interventions necessary Readmission risk has been reviewed: Yes Transition of care needs: no transition of care needs at this time

## 2023-05-04 NOTE — Progress Notes (Signed)
PROGRESS NOTE    Edward Frazier  UJW:[id number] DOB: 1946-07-27 DOA: 05/03/2023 PCP: Cleatis Polka., MD    Brief Narrative:  77 year old with a history of postpolio syndrome, HTN, and HLD who presented to the ER after he was awoken at 3:30 AM with ill-defined non-localizing but moderately severe abdominal pain accompanied by discomfort in the upper back as well as between the shoulder blades.  He was in his usual state of health when going to bed the night before.  His pain persisted, and therefore he summoned EMS. He was given aspirin and nitroglycerin by EMS without improvement. CTa chest abdom pelvis noted findings consistent with acute pancreatitis.  Lipase was found to be significantly elevated at approximately 2400. He denies EtOH consumption. He has no prior hx of pancreatitis, and ROS does not reveal any sx suggestive of chronic GB disease. His pain improved w/ IV morphine, but has not resolved. He denies cp, sob, n/v, diarrhea, or HA. He is not hungry, and pain is worsened with attempts at oral intake.   Assessment and Plan: Acute idiopathic pancreatitis CT noted cholelithiasis but no evidence of acute cholecystitis - no hx of EtOH consumption - no classic offending medications - perhaps he passes a stone last night - otherwise, this may simply be a case of idiopathic acute pancreatitis - his ACEi could be the cause, but to my knowledge this is quite rare so I will not stop it for now - hold statin for now until oral intake resumes   - supportive care w/ IVF - monitor lytes -advance to fulls -check RUQ U/S   Findings on CT chest to suggest 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 and chronic interstitial pulmonary disease Would benefit from outpt Pulmonary follow up - make ambulatory referral at time of d/c    Right upper lobe pulmonary nodules x 2 Patient is a former smoker - I have discussed these findings with the patient, the possibility this could represent malignancy (though not  presently highly suspicious), and the need for ongoing surveillance - I have informed both he and his wife that he will need follow-up CT chest in 3-6 months as outpatient for monitoring (this can be arranged by his PCP or via Pulmonary if the timing of his visit allows)   HTN BP modestly elevated presently - continue usual home meds and follow - pain likely contributing so will not add a new agent at this time    HLD -triglycerides normal - hold statin until pancreatitis improved and oral intake resumed   Constipation -bowel regimen   DVT prophylaxis: enoxaparin (LOVENOX) injection 40 mg Start: 05/03/23 1000    Code Status: Full Code Family Communication: at bedside  Disposition Plan:  Level of care: Med-Surg Status is: Inpatient Remains inpatient appropriate    Consultants:  none   Subjective: Still with pain and nausea  Objective: Vitals:   05/03/23 1610 05/03/23 1937 05/04/23 0411 05/04/23 0756  BP: 132/70 133/73 138/67 133/63  Pulse: 79 86 88 83  Resp: 18 17 17 18   Temp: 98.3 F (36.8 C) 98.6 F (37 C) 98.8 F (37.1 C) 98.8 F (37.1 C)  TempSrc:   Oral   SpO2: 96% 95% 94% 94%  Weight:      Height:       No intake or output data in the 24 hours ending 05/04/23 1132 Filed Weights   05/03/23 0506  Weight: 81 kg    Examination:   General: Appearance:     Overweight  male in no acute distress     Lungs:     respirations unlabored  Heart:    Normal heart rate. Normal rhythm. No murmurs, rubs, or gallops.    MS:   All extremities are intact.    Neurologic:   Awake, alert, oriented x 3. No apparent focal neurological           defect.        Data Reviewed: I have personally reviewed following labs and imaging studies  CBC: Recent Labs  Lab 05/03/23 0543 05/03/23 0554 05/04/23 0300  WBC 9.6  --  14.4*  NEUTROABS 6.9  --   --   HGB 14.1 13.9 13.8  HCT 42.0 41.0 41.8  MCV 98.6  --  95.9  PLT 160  --  129*   Basic Metabolic Panel: Recent  Labs  Lab 05/03/23 0543 05/03/23 0554 05/04/23 0300  NA 138 140 134*  K 3.8 3.8 4.1  CL 105 106 102  CO2 22  --  24  GLUCOSE 170* 168* 135*  BUN 20 21 10   CREATININE 0.86 0.80 0.62  CALCIUM 8.6*  --  8.2*  MG  --   --  1.7   GFR: Estimated Creatinine Clearance: 78.9 mL/min (by C-G formula based on SCr of 0.62 mg/dL). Liver Function Tests: Recent Labs  Lab 05/03/23 0543 05/04/23 0300  AST 59* 55*  ALT 32 53*  ALKPHOS 63 62  BILITOT 1.0 0.9  PROT 6.9 6.3*  ALBUMIN 3.4* 3.1*   Recent Labs  Lab 05/03/23 0543 05/04/23 0300  LIPASE 2,378* 613*   No results for input(s): "AMMONIA" in the last 168 hours. Coagulation Profile: No results for input(s): "INR", "PROTIME" in the last 168 hours. Cardiac Enzymes: No results for input(s): "CKTOTAL", "CKMB", "CKMBINDEX", "TROPONINI" in the last 168 hours. BNP (last 3 results) No results for input(s): "PROBNP" in the last 8760 hours. HbA1C: Recent Labs    05/04/23 0300  HGBA1C 5.6   CBG: No results for input(s): "GLUCAP" in the last 168 hours. Lipid Profile: Recent Labs    05/04/23 0300  CHOL 87  HDL 45  LDLCALC 35  TRIG 36  CHOLHDL 1.9   Thyroid Function Tests: No results for input(s): "TSH", "T4TOTAL", "FREET4", "T3FREE", "THYROIDAB" in the last 72 hours. Anemia Panel: No results for input(s): "VITAMINB12", "FOLATE", "FERRITIN", "TIBC", "IRON", "RETICCTPCT" in the last 72 hours. Sepsis Labs: No results for input(s): "PROCALCITON", "LATICACIDVEN" in the last 168 hours.  No results found for this or any previous visit (from the past 240 hour(s)).       Radiology Studies: CT Angio Chest/Abd/Pel for Dissection W and/or Wo Contrast  Result Date: 05/03/2023 CLINICAL DATA:  chest pain suspicious for acute aortic syndrome. EXAM: CT ANGIOGRAPHY CHEST, ABDOMEN AND PELVIS TECHNIQUE: Non-contrast CT of the chest was Initially obtained. Multidetector CT imaging through the chest, abdomen and pelvis was performed using  the standard protocol during bolus administration of intravenous contrast. Multiplanar reconstructed images and MIPs were obtained and reviewed to evaluate the vascular anatomy. RADIATION DOSE REDUCTION: This exam was performed according to the departmental dose-optimization program which includes automated exposure control, adjustment of the mA and/or kV according to patient size and/or use of iterative reconstruction technique. CONTRAST:  75mL OMNIPAQUE IOHEXOL 350 MG/ML SOLN COMPARISON:  Portable chest today, and partial chest CT for coronary artery calcium scoring dated 12/03/2019. No other cross-sectional imaging for comparison. FINDINGS: CTA CHEST FINDINGS Cardiovascular: The cardiac size is normal. There calcifications in the left  main, lad, and circumflex and proximal right coronary arteries, heaviest in the left main and proximal LAD. There is no pericardial effusion. There are mildly distended superior pulmonary veins. The pulmonary trunk is prominent 3.4 cm indicating arterial hypertension, was previously 2.7 cm. Pulmonary arteries are centrally clear. There is mild aortic tortuosity. There is no aortic aneurysm, dissection, hematoma or stenosis. There is mild patchy calcific plaque in the arch and descending aorta, scattered calcifications in the normal caliber great vessels. Mediastinum/Nodes: There is a mildly patulous esophagus with increased wall thickening distally. Probable esophagitis. Endoscopy may be indicated to exclude Barrett's esophagus. There is a small hiatal hernia. The thyroid gland is unremarkable. Axillary spaces are clear. There is no intrathoracic adenopathy. Mild mediastinal lipomatosis. Lungs/Pleura: There are trace pleural effusions. There is no pneumothorax. There is subpleural reticulation bilaterally with an upper zonal predominance, in the lung apices associated with subpleural bronchiolectasis and slight pleural thickening. There are diffuse bronchial thickening without  visible bronchial plugging or bronchiectasis. Mosaicism is noted in the lower lobes and lung apices most likely due to air trapping and small airways disease. No active infiltrates are seen. There are 2 nodules in the base of the right upper lobe anteriorly measuring 6 mm and 5 mm, both visible on 7:71. There are no other appreciable nodules. Musculoskeletal: Mild thoracic kyphosis. There is osteopenia with degenerative changes and bridging enthesopathy of the thoracic spine. There is mild anterior wedging of the T4, T6, T7, and T8 vertebral bodies all with a chronic appearance. On the right there is acromiohumeral abutment consistent with chronic rotator cuff arthropathy. No concerning regional bone lesion is seen. The ribcage is intact. The chest wall is unremarkable. Review of the MIP images confirms the above findings. CTA ABDOMEN AND PELVIS FINDINGS VASCULAR Aorta: There is mild-to-moderate patchy calcific plaque without no aneurysm, dissection or stenosis, or penetrating ulcer. Celiac: There is an 80% vessel origin stenosis in part due to ostial calcific plaques and in part due to compression by the median arcuate ligament of the diaphragm. The vessel otherwise opacifies well with no branch occlusions. SMA: There are nonstenosing ostial calcific plaques over the top of the vessel origin. There is no flow-limiting stenosis, aneurysm or dissection, or branch occlusion. Renals: Both pulmonary arteries are single. Both demonstrate fairly heavy plaques in the proximal 1 cm. On the right this causes a 60% vessel origin stenosis. Right renal artery otherwise opacifies well with no branch occlusions. On the left the calcifications cause a 50% stenosis a few mm distal to the vessel origin. The rest of the left renal artery and branches opacify well. IMA: Patent without evidence of aneurysm, dissection, vasculitis or significant stenosis. Inflow: There are moderate calcifications in the common iliac arteries,  bilaterally are nonstenosing. The left common iliac, internal and external iliac arteries are all nearly 50% larger than their right sided counterpart arteries, etiology indeterminate. There are scattered calcifications in the bilateral internal iliac arteries but no flow-limiting stenosis. No stenosis of the external iliac arteries is seen or significant plaques. Veins: Unopacified and not evaluated. Review of the MIP images confirms the above findings. NON-VASCULAR Hepatobiliary: There is streak artifact through the liver due to the patient's arms in the field. No obvious mass is seen. Gallbladder is mildly distended. There is a single subcentimeter calcified stone in the proximal lumen. No biliary dilatation. Pancreas: There is edema in and around the pancreatic head, uncinate process and neck. Findings consistent with acute interstitial pancreatitis. There is trace nonlocalizing peripancreatic  fluid extending into the pancreaticoduodenal groove. There is no pancreatic hemorrhage, abscess, mass enhancement or ductal dilatation. Spleen: No abnormality is seen through the streak artifacts. Adrenals/Urinary Tract: Adrenal glands are unremarkable. Kidneys are normal, without renal calculi, focal lesion, or hydronephrosis. The bladder is mildly thickened but is not fully distended. There is no masslike thickening. Stomach/Bowel: No dilatation or wall thickening including the appendix. Moderate fecal stasis. There is sigmoid diverticulosis without evidence of acute diverticulitis. Lymphatic: No lymphadenopathy is seen. Reproductive: There is no prostatomegaly. Both testicles are in the scrotal sac. Other: No abdominal wall hernia or abnormality. No abdominopelvic ascites. Musculoskeletal: There are chronic healed fracture deformities of the right hemipelvis and proximal right femur, with an intramedullary rod in the right femur partially imaged, with a dynamic compression screw in the right femoral neck and head. 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 There is ankylosis over the right SI joint. No destructive osseous lesions. Degenerative change lumbar spine. Review of the MIP images confirms the above findings. IMPRESSION: 1. Aortic and coronary artery atherosclerosis without evidence of acute aortic syndrome. 2. Prominent pulmonary trunk 3.4 cm, previously 2.7 cm, could be due to pulmonary arterial hypertension. No central embolus is seen on this nondedicated study. 3. Trace pleural effusions with mildly distended superior pulmonary veins. No overt edema. 4. Subpleural reticulation with an upper zonal predominance, subpleural bronchiolectasis and slight pleural thickening in the lung apices. 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 No active infiltrates are seen. 5. Two nodules in the base of the right upper lobe measuring 6 mm and 5 mm. Per Fleischner Society Guidelines, recommend a non-contrast Chest CT at 3-6 months, then consider another non-contrast Chest CT at 18-24 months. If patient is low risk for malignancy, non-contrast Chest CT at 18-24 months is optional. These guidelines do not apply to immunocompromised patients and patients with cancer. Follow up in patients with significant comorbidities as clinically warranted. For lung cancer screening, adhere to Lung-RADS guidelines. Reference: Radiology. 2017; 284(1):228-43. 6. 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 7. Aortoiliac nonstenosing calcific plaques, with the left iliac arteries about 50% larger than the right, etiology unclear but there is evidence of old healed fractures of the right pelvis and proximal right femur as well. 8. CT findings of acute interstitial proximal pancreatitis with trace peripancreatic fluid. 9. Cholelithiasis without evidence of acute cholecystitis. 10. Cystitis versus bladder  nondistention. 11. Constipation and diverticulosis. Electronically Signed   By: Almira Bar M.D.   On: 05/03/2023 06:55   DG Chest Portable 1 View  Result Date: 05/03/2023 CLINICAL DATA:  77 year old male with history of chest pain. EXAM: PORTABLE CHEST 1 VIEW COMPARISON:  No priors. FINDINGS: Lung volumes are low. Scattered areas of coarse interstitial markings are noted throughout the lungs bilaterally, most apparent in the left mid to lower lung where there is also some peribronchial cuffing, and in the right upper lobe near the apex where there is associated pleural-parenchymal thickening, which could reflect chronic scarring (no prior studies are available for comparison). No confluent consolidative airspace disease. No pleural effusions. No pneumothorax. No evidence of pulmonary edema. Heart size is normal. The patient is rotated to the right on today's exam, resulting in distortion of the mediastinal contours and reduced diagnostic sensitivity and specificity for mediastinal pathology. IMPRESSION: 1. Patchy areas of interstitial prominence and peribronchial cuffing, which may suggest  an acute bronchitis, potentially with developing bronchopneumonia, most evident in the left mid to lower lung. Followup PA and lateral chest X-ray is recommended in 3-4 weeks following trial of antibiotic therapy to ensure resolution and exclude underlying malignancy. 2. Probable chronic scarring in the apex of the right upper lobe. Attention at time of follow-up chest x-ray is recommended to ensure stability. Electronically Signed   By: Trudie Reed M.D.   On: 05/03/2023 05:57        Scheduled Meds:  aspirin  81 mg Oral q AM   [START ON 05/05/2023] doxycycline  100 mg Oral Once per day on Monday Thursday   enoxaparin (LOVENOX) injection  40 mg Subcutaneous Q24H   lisinopril  20 mg Oral q AM   polyethylene glycol  17 g Oral BID   Continuous Infusions:  dextrose 5% lactated ringers with KCl 20 mEq/L 75  mL/hr at 05/04/23 0336     LOS: 1 day    Time spent: 45 minutes spent on chart review, discussion with nursing staff, consultants, updating family and interview/physical exam; more than 50% of that time was spent in counseling and/or coordination of care.    Joseph Art, DO [facility] Available via Epic secure chat 7am-7pm After these hours, please refer to coverage provider listed on [email or web address] 05/04/2023, 11:32 AM

## 2023-05-04 NOTE — Plan of Care (Signed)
Patient alert/oriented X4. Patient compliant with medication administration. Zofran and dilaudid given as needed for pain and nausea. Patient tolerating cont. Fluids and pending MRI of abdomen. Patient ambulated around halls and unit without having any issues. VSS, will continue to monitor.  Problem: Education: Goal: Knowledge of [place] [facility] Education information/materials will improve Outcome: Progressing

## 2023-05-05 ENCOUNTER — Inpatient Hospital Stay (HOSPITAL_COMMUNITY): Payer: Medicare PPO

## 2023-05-05 DIAGNOSIS — I272 Pulmonary hypertension, unspecified: Secondary | ICD-10-CM | POA: Diagnosis not present

## 2023-05-05 DIAGNOSIS — K859 Acute pancreatitis without necrosis or infection, unspecified: Secondary | ICD-10-CM | POA: Diagnosis not present

## 2023-05-05 LAB — COMPREHENSIVE METABOLIC PANEL
ALT: 33 U/L (ref 0–44)
AST: 44 U/L — ABNORMAL HIGH (ref 15–41)
Albumin: 2.8 g/dL — ABNORMAL LOW (ref 3.5–5.0)
Alkaline Phosphatase: 54 U/L (ref 38–126)
Anion gap: 6 (ref 5–15)
BUN: 9 mg/dL (ref 8–23)
CO2: 23 mmol/L (ref 22–32)
Calcium: 8 mg/dL — ABNORMAL LOW (ref 8.9–10.3)
Chloride: 102 mmol/L (ref 98–111)
Creatinine, Ser: 0.68 mg/dL (ref 0.61–1.24)
GFR, Estimated: >60 mL/min (ref >60)
Glucose, Bld: 95 mg/dL (ref 70–99)
Potassium: 3.9 mmol/L (ref 3.5–5.1)
Sodium: 131 mmol/L — ABNORMAL LOW (ref 135–145)
Total Bilirubin: 1.2 mg/dL (ref 0.3–1.2)
Total Protein: 6.1 g/dL — ABNORMAL LOW (ref 6.5–8.1)

## 2023-05-05 LAB — CBC
HCT: 40.4 % (ref 39.0–52.0)
Hemoglobin: 13.4 g/dL (ref 13.0–17.0)
MCH: 32.3 pg (ref 26.0–34.0)
MCHC: 33.2 g/dL (ref 30.0–36.0)
MCV: 97.3 fL (ref 80.0–100.0)
Platelets: 121 10*3/uL — ABNORMAL LOW (ref 150–400)
RBC: 4.15 MIL/uL — ABNORMAL LOW (ref 4.22–5.81)
RDW: 13.1 % (ref 11.5–15.5)
WBC: 17.2 10*3/uL — ABNORMAL HIGH (ref 4.0–10.5)
nRBC: 0 % (ref 0.0–0.2)

## 2023-05-05 MED ORDER — TRAMADOL HCL 50 MG PO TABS
50.0000 mg | ORAL_TABLET | Freq: Two times a day (BID) | ORAL | Status: DC | PRN
  Administered 2023-05-05 – 2023-05-06 (×2): 50 mg via ORAL
  Filled 2023-05-05 (×2): qty 1

## 2023-05-05 MED ORDER — SIMETHICONE 80 MG PO CHEW
80.0000 mg | CHEWABLE_TABLET | Freq: Four times a day (QID) | ORAL | Status: DC | PRN
  Administered 2023-05-05 (×2): 80 mg via ORAL
  Filled 2023-05-05 (×2): qty 1

## 2023-05-05 MED ORDER — MAGNESIUM GLUCONATE 500 MG PO TABS
500.0000 mg | ORAL_TABLET | Freq: Every day | ORAL | Status: DC
  Administered 2023-05-05 – 2023-05-06 (×2): 500 mg via ORAL
  Filled 2023-05-05 (×2): qty 1

## 2023-05-05 NOTE — Progress Notes (Signed)
PROGRESS NOTE    Edward Frazier  YNW:[id number] DOB: [birth year] DOA: 05/03/2023 PCP: Cleatis Polka., MD    Brief Narrative:  77 year old with a history of postpolio syndrome, HTN, and HLD who presented to the ER after he was awoken at 3:30 AM with ill-defined non-localizing but moderately severe abdominal pain accompanied by discomfort in the upper back as well as between the shoulder blades.  He was in his usual state of health when going to bed the night before.  His pain persisted, and therefore he summoned EMS. He was given aspirin and nitroglycerin by EMS without improvement. CTa chest abdom pelvis noted findings consistent with acute pancreatitis.  Continues to have abdominal distention and discomfort  Assessment and Plan: Acute idiopathic pancreatitis CT noted cholelithiasis but no evidence of acute cholecystitis - no hx of EtOH consumption - no classic offending medications - perhaps he passes a stone last night - otherwise, this may simply be a case of idiopathic acute pancreatitis - his ACEi could be the cause, but to my knowledge this is quite rare so I will not stop it for now - hold statin for now until oral intake resumes   - supportive care w/ IVF - monitor lytes -advance diet -RUQ U/s unrevealing  Abd distention -ambulate -check dg abd   Findings on CT chest to suggest 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 and chronic interstitial pulmonary disease Would benefit from outpt Pulmonary follow up - make ambulatory referral at time of d/c    Right upper lobe pulmonary nodules x 2 Patient is a former smoker - I have discussed these findings with the patient, the possibility this could represent malignancy (though not presently highly suspicious), and the need for ongoing surveillance - I have informed both he and his wife that he will need follow-up CT chest in 3-6 months as outpatient for monitoring (this can be arranged by his PCP or via Pulmonary if the timing of his visit  allows)   HTN BP modestly elevated presently - continue usual home meds and follow - pain likely contributing so will not add a new agent at this time    HLD -triglycerides normal - hold statin until pancreatitis improved and oral intake resumed   Constipation -bowel regimen  Leukocytosis -?reactive -no fever  DVT prophylaxis: enoxaparin (LOVENOX) injection 40 mg Start: 05/03/23 1000    Code Status: Full Code Family Communication: at bedside  Disposition Plan:  Level of care: Med-Surg Status is: Inpatient Remains inpatient appropriate    Consultants:  none   Subjective: Ambulating but still does not fell well-- nausea and pain  Objective: Vitals:   05/04/23 1723 05/04/23 2017 05/05/23 0458 05/05/23 0824  BP: (!) 160/76 (!) 155/72 (!) 142/73 138/67  Pulse: 91 92 97 83  Resp: 18 17 19 18   Temp: 98.5 F (36.9 C) 98.6 F (37 C) 98.4 F (36.9 C) (!) 97.5 F (36.4 C)  TempSrc: Oral Oral Oral   SpO2: 94% 97% 93% 93%  Weight:      Height:        Intake/Output Summary (Last 24 hours) at 05/05/2023 1355 Last data filed at 05/05/2023 1144 Gross per 24 hour  Intake 3639.92 ml  Output --  Net 3639.92 ml   Filed Weights   05/03/23 0506  Weight: 81 kg    Examination:   General: Appearance:     Overweight male in no acute distress   Sluggish BS, abd distended  Lungs:     respirations unlabored  Heart:    Normal heart rate. .    MS:   All extremities are intact.    Neurologic:   Awake, alert, oriented x 3. No apparent focal neurological           defect.        Data Reviewed: I have personally reviewed following labs and imaging studies  CBC: Recent Labs  Lab 05/03/23 0543 05/03/23 0554 05/04/23 0300 05/05/23 1044  WBC 9.6  --  14.4* 17.2*  NEUTROABS 6.9  --   --   --   HGB 14.1 13.9 13.8 13.4  HCT 42.0 41.0 41.8 40.4  MCV 98.6  --  95.9 97.3  PLT 160  --  129* 121*   Basic Metabolic Panel: Recent Labs  Lab 05/03/23 0543 05/03/23 0554  05/04/23 0300 05/05/23 1044  NA 138 140 134* 131*  K 3.8 3.8 4.1 3.9  CL 105 106 102 102  CO2 22  --  24 23  GLUCOSE 170* 168* 135* 95  BUN 20 21 10 9   CREATININE 0.86 0.80 0.62 0.68  CALCIUM 8.6*  --  8.2* 8.0*  MG  --   --  1.7  --    GFR: Estimated Creatinine Clearance: 78.9 mL/min (by C-G formula based on SCr of 0.68 mg/dL). Liver Function Tests: Recent Labs  Lab 05/03/23 0543 05/04/23 0300 05/05/23 1044  AST 59* 55* 44*  ALT 32 53* 33  ALKPHOS 63 62 54  BILITOT 1.0 0.9 1.2  PROT 6.9 6.3* 6.1*  ALBUMIN 3.4* 3.1* 2.8*   Recent Labs  Lab 05/03/23 0543 05/04/23 0300  LIPASE 2,378* 613*   No results for input(s): "AMMONIA" in the last 168 hours. Coagulation Profile: No results for input(s): "INR", "PROTIME" in the last 168 hours. Cardiac Enzymes: No results for input(s): "CKTOTAL", "CKMB", "CKMBINDEX", "TROPONINI" in the last 168 hours. BNP (last 3 results) No results for input(s): "PROBNP" in the last 8760 hours. HbA1C: Recent Labs    05/04/23 0300  HGBA1C 5.6   CBG: No results for input(s): "GLUCAP" in the last 168 hours. Lipid Profile: Recent Labs    05/04/23 0300  CHOL 87  HDL 45  LDLCALC 35  TRIG 36  CHOLHDL 1.9   Thyroid Function Tests: No results for input(s): "TSH", "T4TOTAL", "FREET4", "T3FREE", "THYROIDAB" in the last 72 hours. Anemia Panel: No results for input(s): "VITAMINB12", "FOLATE", "FERRITIN", "TIBC", "IRON", "RETICCTPCT" in the last 72 hours. Sepsis Labs: No results for input(s): "PROCALCITON", "LATICACIDVEN" in the last 168 hours.  No results found for this or any previous visit (from the past 240 hour(s)).       Radiology Studies: US Abdomen Limited RUQ (LIVER/GB)  Result Date: 05/04/2023 CLINICAL DATA:  Pancreatitis EXAM: ULTRASOUND ABDOMEN LIMITED RIGHT UPPER QUADRANT COMPARISON:  CT 05/03/2019 FINDINGS: Gallbladder: Distended gallbladder with some dependent stones. No adjacent fluid but there is some gallbladder wall  thickening of up to 5 mm. Common bile duct: Diameter: 2 mm Liver: No focal lesion identified. Within normal limits in parenchymal echogenicity. Portal vein is patent on color Doppler imaging with normal direction of blood flow towards the liver. Other: Small pleural effusion.  This has not seen on the prior CT IMPRESSION: Gallstones. There is gallbladder wall thickening but no adjacent fluid. No reported sonographic Murphy's sign. If there is further concern of acute cholecystitis, HIDA scan could be considered. No biliary ductal dilatation. Small pleural effusion.  This was not seen previously. Electronically Signed   By: Piedad Climes.D.  On: 05/04/2023 19:00        Scheduled Meds:  aspirin  81 mg Oral q AM   doxycycline  100 mg Oral Once per day on Monday Thursday   enoxaparin (LOVENOX) injection  40 mg Subcutaneous Q24H   lisinopril  20 mg Oral q AM   magnesium gluconate  500 mg Oral Daily   polyethylene glycol  17 g Oral BID   Continuous Infusions:     LOS: 2 days    Time spent: 45 minutes spent on chart review, discussion with nursing staff, consultants, updating family and interview/physical exam; more than 50% of that time was spent in counseling and/or coordination of care.    Joseph Art, DO [facility] Available via Epic secure chat 7am-7pm After these hours, please refer to coverage provider listed on [email or web address] 05/05/2023, 1:55 PM

## 2023-05-05 NOTE — Plan of Care (Signed)
Problem: Education: Goal: Knowledge of General Education information will improve Description: Including pain rating scale, medication(s)/side effects and non-pharmacologic comfort measures Outcome: Progressing Pt understands he was admitted into the hospital for abdominal pain radiating to his upper back between his shoulder blades.  Per CT scan it is acute pancreatitis.    Problem: Clinical Measurements: Goal: Ability to maintain clinical measurements within normal limits will improve Outcome: Progressing VS WNL thus far except for his BP which was elevated.  He is on antihypertensives per MD's orders.    Problem: Clinical Measurements: Goal: Will remain free from infection Outcome: Progressing S/Sx of infection monitored and assessed q-shift.  Pt has remained afebrile thus far.  His WBC remain elevated at 17.2 on 05/05/2023 at 1044.  Pt is on PO abx per MD's orders.      Problem: Clinical Measurements: Goal: Respiratory complications will improve Outcome: Progressing Respiratory status monitored and assessed q-shift.  Pt is on room air with PO2 saturations at 93% and respiration rate of 18 breaths per minute.  He has not endorsed c/o SOE or DOE.     Problem: Clinical Measurements: Goal: Cardiovascular complication will be avoided Outcome: Progressing Pt's HR have been WNL except for his BP which was elevated.   He is on antihypertensives per MD's orders.  Pt has not endorsed c/o SOE/ DOE or CP/ palpation.      Problem: Activity: Goal: Risk for activity intolerance will decrease Outcome: Progressing Pt has been OOB to the bathroom several times independently. He was observed walking with a steady gait.  Today he ambulated the hallways with Care RN and his wife.  He managed to walk over 379ft.       Problem: Nutrition: Goal: Adequate nutrition will be maintained Outcome: Progressing Pt is on soft diet per MD's orders.  He has been able to eat 50-80% of his meals thus far.  Pt  continues to endorse c/o abdominal pain/ distention.    Problem: Elimination: Goal: Will not experience complications related to bowel motility Outcome: Progressing Pt has endorse c/o constipation.  His LBM was on 05/02/2023.  MD started pt on bowel regiment today.    Problem: Elimination: Goal: Will not experience complications related to urinary retention Outcome: Progressing Pt has not endorsed dysuria, abdominal pain/ distention thus far.    Problem: Pain Managment: Goal: General experience of comfort will improve Outcome: Progressing Pt has endorsed c/o 4-6/10 bilateral abdominal pain describing it as a constant ache.  Reiterated pain scale so he could adequately rate his pain.  Pt stated his pain goal this admission would be 0/10.  Discussed nonpharmacological methods to help reduce s/sx of pain.  Interventions given per pt's request and MD's orders.    Problem: Safety: Goal: Ability to remain free from injury will improve Outcome: Progressing Pt has remained free from falls thus far. Instructed pt to utilize RN call light for assistance. Hourly rounds performed. Bed in lowest position, locked with two upper side rails engaged. Belongings and call light within reach.    Problem: Skin Integrity: Goal: Risk for impaired skin integrity will decrease Outcome: Progressing Skin integrity monitored and assessed q-shift. Instructed pt to turn q2 hours to prevent further skin impairment.  Tubes and drains assessed for device related pressure sores.  Pt is continent of both bowel and bladder.

## 2023-05-06 ENCOUNTER — Other Ambulatory Visit (HOSPITAL_COMMUNITY): Payer: Self-pay

## 2023-05-06 DIAGNOSIS — K859 Acute pancreatitis without necrosis or infection, unspecified: Secondary | ICD-10-CM | POA: Diagnosis not present

## 2023-05-06 LAB — BASIC METABOLIC PANEL
Anion gap: 8 (ref 5–15)
BUN: 11 mg/dL (ref 8–23)
CO2: 23 mmol/L (ref 22–32)
Calcium: 8 mg/dL — ABNORMAL LOW (ref 8.9–10.3)
Chloride: 101 mmol/L (ref 98–111)
Creatinine, Ser: 0.68 mg/dL (ref 0.61–1.24)
GFR, Estimated: >60 mL/min (ref >60)
Glucose, Bld: 69 mg/dL — ABNORMAL LOW (ref 70–99)
Potassium: 4.1 mmol/L (ref 3.5–5.1)
Sodium: 132 mmol/L — ABNORMAL LOW (ref 135–145)

## 2023-05-06 LAB — CBC
HCT: 37 % — ABNORMAL LOW (ref 39.0–52.0)
Hemoglobin: 12.4 g/dL — ABNORMAL LOW (ref 13.0–17.0)
MCH: 31.8 pg (ref 26.0–34.0)
MCHC: 33.5 g/dL (ref 30.0–36.0)
MCV: 94.9 fL (ref 80.0–100.0)
Platelets: 114 10*3/uL — ABNORMAL LOW (ref 150–400)
RBC: 3.9 MIL/uL — ABNORMAL LOW (ref 4.22–5.81)
RDW: 12.8 % (ref 11.5–15.5)
WBC: 14.5 10*3/uL — ABNORMAL HIGH (ref 4.0–10.5)
nRBC: 0 % (ref 0.0–0.2)

## 2023-05-06 MED ORDER — BISACODYL 10 MG RE SUPP
10.0000 mg | Freq: Every day | RECTAL | Status: DC | PRN
  Filled 2023-05-06: qty 1

## 2023-05-06 MED ORDER — BISACODYL 10 MG RE SUPP: 10.0000 mg | Freq: Every day | RECTAL | Status: DC | PRN

## 2023-05-06 MED ORDER — TRAMADOL HCL 50 MG PO TABS
50.0000 mg | ORAL_TABLET | Freq: Four times a day (QID) | ORAL | 0 refills | Status: DC | PRN
  Filled 2023-05-06: qty 25, 7d supply, fill #0

## 2023-05-06 MED ORDER — TRAMADOL HCL 50 MG PO TABS
50.0000 mg | ORAL_TABLET | Freq: Four times a day (QID) | ORAL | Status: DC | PRN
  Administered 2023-05-06 (×2): 50 mg via ORAL
  Filled 2023-05-06 (×2): qty 1

## 2023-05-06 MED ORDER — POLYETHYLENE GLYCOL 3350 17 G PO PACK: 17.0000 g | PACK | Freq: Two times a day (BID) | ORAL | Status: DC

## 2023-05-06 MED ORDER — ONDANSETRON HCL 4 MG PO TABS
4.0000 mg | ORAL_TABLET | Freq: Four times a day (QID) | ORAL | 0 refills | Status: DC | PRN
  Filled 2023-05-06: qty 20, 5d supply, fill #0

## 2023-05-06 NOTE — Discharge Summary (Signed)
Physician Discharge Summary  Edward Frazier YQM:[id number] DOB: 12/11/1945 DOA: 05/03/2023  PCP: Cleatis Polka., MD  Admit date: 05/03/2023 Discharge date: 05/06/2023  Admitted From: home Discharge disposition: home   Recommendations for Outpatient Follow-Up:   Bowel regimen/follow up constipation Continues monitoring for pancreatitis    Discharge Diagnosis:   Principal Problem:   Acute pancreatitis    Discharge Condition: Improved.  Diet recommendation:  Regular.  Wound care: None.  Code status: Full.   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  He was in his usual state of health when going to bed the night before.  His pain persisted, and therefore he summoned EMS. He was given aspirin and nitroglycerin by EMS without improvement. CTa chest abdom pelvis noted findings consistent with acute pancreatitis.  Lipase was found to be significantly elevated at approximately 2400. He denies EtOH consumption. He has no prior hx of pancreatitis, and ROS does not reveal any sx suggestive of chronic GB disease. His pain improved w/ IV morphine, but has not resolved. He denies cp, sob, n/v, diarrhea, or HA. He is not hungry, and pain is worsened with attempts at oral intake.    Hospital Course by Problem:   Acute idiopathic pancreatitis CT noted cholelithiasis but no evidence of acute cholecystitis - no hx of EtOH consumption - no classic offending medications - perhaps he passes a stone last night - otherwise, this may simply be a case of idiopathic acute pancreatitis - his ACEi could be the cause, but to my knowledge this is quite rare so I will not stop it for now - -tolerating an advanced diet -outpatient follow  up   Findings on CT chest to suggest 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 and chronic interstitial pulmonary disease Would benefit from outpt Pulmonary follow up - placed ambulatory referral   Right upper lobe pulmonary nodules x 2 - informed both he and his wife that he will need follow-up CT chest in 3-6 months as outpatient for monitoring (this can be arranged by his PCP or via Pulmonary if the timing of his visit allows)   Hyperglycemia -resolved   HTN -continue home meds   HLD -triglycerides ok    Medical Consultants:      Discharge Exam:   Vitals:   05/06/23 0435 05/06/23 0737  BP: (!) 151/81 131/68  Pulse: 86 90  Resp: 18 16  Temp: 98.3 F (36.8 C) 98.1 F (36.7 C)  SpO2: 96% 94%   Vitals:   05/05/23 1639 05/05/23 1950 05/06/23 0435 05/06/23 0737  BP: (!) 150/66 (!) 154/79 (!) 151/81 131/68  Pulse: 83 86 86 90  Resp: 18 18 18 16   Temp: (!) 97.5 F (36.4 C) 98.2 F (36.8 C) 98.3 F (36.8 C) 98.1 F (36.7 C)  TempSrc: Oral Oral Oral Oral  SpO2: 93% 96% 96% 94%  Weight:      Height:        General exam: Appears calm and comfortable.     The results of significant diagnostics from this hospitalization (including imaging, microbiology, ancillary and laboratory) are listed below for reference.     Procedures and Diagnostic Studies:   US Abdomen Limited RUQ (LIVER/GB)  Result Date: 05/04/2023 CLINICAL DATA:  Pancreatitis EXAM: ULTRASOUND ABDOMEN LIMITED RIGHT UPPER QUADRANT COMPARISON:  CT 05/03/2019 FINDINGS: Gallbladder: Distended gallbladder with some dependent stones. No adjacent fluid but there is some gallbladder wall thickening of up to 5 mm. Common bile duct: Diameter: 2 mm Liver: No focal lesion identified. Within normal limits in parenchymal echogenicity. Portal vein is patent on color Doppler imaging with normal direction of blood flow towards the liver. Other: Small pleural effusion.  This has not seen on the prior CT IMPRESSION: Gallstones.  There is gallbladder wall thickening but no adjacent fluid. No reported sonographic Murphy's sign. If there is further concern of acute cholecystitis, HIDA scan could be considered. No biliary ductal dilatation. Small pleural effusion.  This was not seen previously. Electronically Signed   By: Karen Kays M.D.   On: 05/04/2023 19:00   CT Angio Chest/Abd/Pel for Dissection W and/or Wo Contrast  Result Date: 05/03/2023 CLINICAL DATA:  chest pain suspicious for acute aortic syndrome. EXAM: CT ANGIOGRAPHY CHEST, ABDOMEN AND PELVIS TECHNIQUE: Non-contrast CT of the chest was Initially obtained. Multidetector CT imaging through the chest, abdomen and pelvis was performed using the standard protocol during bolus administration of intravenous contrast. Multiplanar reconstructed images and MIPs were obtained and reviewed to evaluate the vascular anatomy. RADIATION DOSE REDUCTION: This exam was performed according to the departmental dose-optimization program which includes automated exposure control, adjustment of the mA and/or kV according to patient size and/or use of iterative reconstruction technique. CONTRAST:  75mL OMNIPAQUE IOHEXOL 350 MG/ML SOLN COMPARISON:  Portable chest today, and partial chest CT for coronary artery calcium scoring dated 12/03/2019. No other cross-sectional imaging for comparison. FINDINGS: CTA CHEST FINDINGS Cardiovascular: The cardiac size is normal. There calcifications in the left main, lad, and circumflex and proximal right coronary arteries, heaviest in the left main and proximal LAD. There is no pericardial effusion. There are mildly distended superior pulmonary veins. The pulmonary trunk is prominent 3.4 cm indicating arterial hypertension, was previously 2.7 cm. Pulmonary arteries are centrally clear. There is mild aortic tortuosity. There is no aortic aneurysm, dissection, hematoma or stenosis. There is mild patchy calcific plaque in the arch and descending aorta, scattered  calcifications in the normal caliber great vessels. Mediastinum/Nodes: There is a mildly patulous esophagus with increased wall thickening distally. Probable esophagitis. Endoscopy may be indicated to exclude Barrett's esophagus. There is a small hiatal hernia. The thyroid gland is unremarkable. Axillary spaces are clear. There is no intrathoracic adenopathy. Mild mediastinal lipomatosis. Lungs/Pleura: There are trace pleural effusions. There is no pneumothorax. There is subpleural reticulation bilaterally with an upper zonal predominance, in the lung apices associated with subpleural bronchiolectasis and slight pleural thickening. There are diffuse bronchial thickening without visible bronchial plugging or bronchiectasis. Mosaicism is noted in the lower lobes and lung apices most likely due to air trapping and small airways disease. No active infiltrates are seen. There are 2 nodules in the base of the right upper lobe anteriorly measuring 6 mm and 5 mm, both visible on 7:71. There are no other appreciable nodules. Musculoskeletal: Mild thoracic kyphosis. There is osteopenia with degenerative changes and bridging enthesopathy of the thoracic spine. There is mild anterior wedging of the T4, T6, T7, and T8 vertebral bodies all with a chronic appearance. On the right there is acromiohumeral abutment consistent with chronic rotator cuff arthropathy. No concerning regional bone lesion is seen. The ribcage is intact. The chest wall is unremarkable. Review of the MIP images confirms the above findings. CTA ABDOMEN AND PELVIS FINDINGS VASCULAR Aorta:  There is mild-to-moderate patchy calcific plaque without no aneurysm, dissection or stenosis, or penetrating ulcer. Celiac: There is an 80% vessel origin stenosis in part due to ostial calcific plaques and in part due to compression by the median arcuate ligament of the diaphragm. The vessel otherwise opacifies well with no branch occlusions. SMA: There are nonstenosing ostial  calcific plaques over the top of the vessel origin. There is no flow-limiting stenosis, aneurysm or dissection, or branch occlusion. Renals: Both pulmonary arteries are single. Both demonstrate fairly heavy plaques in the proximal 1 cm. On the right this causes a 60% vessel origin stenosis. Right renal artery otherwise opacifies well with no branch occlusions. On the left the calcifications cause a 50% stenosis a few mm distal to the vessel origin. The rest of the left renal artery and branches opacify well. IMA: Patent without evidence of aneurysm, dissection, vasculitis or significant stenosis. Inflow: There are moderate calcifications in the common iliac arteries, bilaterally are nonstenosing. The left common iliac, internal and external iliac arteries are all nearly 50% larger than their right sided counterpart arteries, etiology indeterminate. There are scattered calcifications in the bilateral internal iliac arteries but no flow-limiting stenosis. No stenosis of the external iliac arteries is seen or significant plaques. Veins: Unopacified and not evaluated. Review of the MIP images confirms the above findings. NON-VASCULAR Hepatobiliary: There is streak artifact through the liver due to the patient's arms in the field. No obvious mass is seen. Gallbladder is mildly distended. There is a single subcentimeter calcified stone in the proximal lumen. No biliary dilatation. Pancreas: There is edema in and around the pancreatic head, uncinate process and neck. Findings consistent with acute interstitial pancreatitis. There is trace nonlocalizing peripancreatic fluid extending into the pancreaticoduodenal groove. There is no pancreatic hemorrhage, abscess, mass enhancement or ductal dilatation. Spleen: No abnormality is seen through the streak artifacts. Adrenals/Urinary Tract: Adrenal glands are unremarkable. Kidneys are normal, without renal calculi, focal lesion, or hydronephrosis. The bladder is mildly thickened  but is not fully distended. There is no masslike thickening. Stomach/Bowel: No dilatation or wall thickening including the appendix. Moderate fecal stasis. There is sigmoid diverticulosis without evidence of acute diverticulitis. Lymphatic: No lymphadenopathy is seen. Reproductive: There is no prostatomegaly. Both testicles are in the scrotal sac. Other: No abdominal wall hernia or abnormality. No abdominopelvic ascites. Musculoskeletal: There are chronic healed fracture deformities of the right hemipelvis and proximal right femur, with an intramedullary rod in the right femur partially imaged, with a dynamic compression screw in the right femoral neck and head. 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 There is ankylosis over the right SI joint. No destructive osseous lesions. Degenerative change lumbar spine. Review of the MIP images confirms the above findings. IMPRESSION: 1. Aortic and coronary artery atherosclerosis without evidence of acute aortic syndrome. 2. Prominent pulmonary trunk 3.4 cm, previously 2.7 cm, could be due to pulmonary arterial hypertension. No central embolus is seen on this nondedicated study. 3. Trace pleural effusions with mildly distended superior pulmonary veins. No overt edema. 4. Subpleural reticulation with an upper zonal predominance, subpleural bronchiolectasis and slight pleural thickening in the lung apices. 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 No active infiltrates are seen. 5. Two nodules in the base of the right upper lobe measuring 6 mm and 5 mm. Per Fleischner Society Guidelines, recommend a non-contrast Chest CT at 3-6 months, then consider another non-contrast Chest CT at  18-24 months. If patient is low risk for malignancy, non-contrast Chest CT at 18-24 months is optional. These guidelines do not apply to immunocompromised patients and patients with cancer. Follow  up in patients with significant comorbidities as clinically warranted. For lung cancer screening, adhere to Lung-RADS guidelines. Reference: Radiology. 2017; 284(1):228-43. 6. 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 7. Aortoiliac nonstenosing calcific plaques, with the left iliac arteries about 50% larger than the right, etiology unclear but there is evidence of old healed fractures of the right pelvis and proximal right femur as well. 8. CT findings of acute interstitial proximal pancreatitis with trace peripancreatic fluid. 9. Cholelithiasis without evidence of acute cholecystitis. 10. Cystitis versus bladder nondistention. 11. Constipation and diverticulosis. Electronically Signed   By: Almira Bar M.D.   On: 05/03/2023 06:55   DG Chest Portable 1 View  Result Date: 05/03/2023 CLINICAL DATA:  77 year old male with history of chest pain. EXAM: PORTABLE CHEST 1 VIEW COMPARISON:  No priors. FINDINGS: Lung volumes are low. Scattered areas of coarse interstitial markings are noted throughout the lungs bilaterally, most apparent in the left mid to lower lung where there is also some peribronchial cuffing, and in the right upper lobe near the apex where there is associated pleural-parenchymal thickening, which could reflect chronic scarring (no prior studies are available for comparison). No confluent consolidative airspace disease. No pleural effusions. No pneumothorax. No evidence of pulmonary edema. Heart size is normal. The patient is rotated to the right on today's exam, resulting in distortion of the mediastinal contours and reduced diagnostic sensitivity and specificity for mediastinal pathology. IMPRESSION: 1. Patchy areas of interstitial prominence and peribronchial cuffing, which may suggest an acute bronchitis, potentially with developing bronchopneumonia, most evident in the left mid to lower lung. Followup PA and lateral chest X-ray is recommended in  3-4 weeks following trial of antibiotic therapy to ensure resolution and exclude underlying malignancy. 2. Probable chronic scarring in the apex of the right upper lobe. Attention at time of follow-up chest x-ray is recommended to ensure stability. Electronically Signed   By: Trudie Reed M.D.   On: 05/03/2023 05:57     Labs:   Basic Metabolic Panel: Recent Labs  Lab 05/03/23 0543 05/03/23 0554 05/04/23 0300 05/05/23 1044 05/06/23 0748  NA 138 140 134* 131* 132*  K 3.8 3.8 4.1 3.9 4.1  CL 105 106 102 102 101  CO2 22  --  24 23 23   GLUCOSE 170* 168* 135* 95 69*  BUN 20 21 10 9 11   CREATININE 0.86 0.80 0.62 0.68 0.68  CALCIUM 8.6*  --  8.2* 8.0* 8.0*  MG  --   --  1.7  --   --    GFR Estimated Creatinine Clearance: 78.9 mL/min (by C-G formula based on SCr of 0.68 mg/dL). Liver Function Tests: Recent Labs  Lab 05/03/23 0543 05/04/23 0300 05/05/23 1044  AST 59* 55* 44*  ALT 32 53* 33  ALKPHOS 63 62 54  BILITOT 1.0 0.9 1.2  PROT 6.9 6.3* 6.1*  ALBUMIN 3.4* 3.1* 2.8*   Recent Labs  Lab 05/03/23 0543 05/04/23 0300  LIPASE 2,378* 613*   No results for input(s): "AMMONIA" in the last 168 hours. Coagulation profile No results for input(s): "INR", "PROTIME" in the last 168 hours.  CBC: Recent Labs  Lab 05/03/23 0543 05/03/23 0554 05/04/23 0300 05/05/23 1044 05/06/23 0748  WBC 9.6  --  14.4* 17.2* 14.5*  NEUTROABS 6.9  --   --   --   --  HGB 14.1 13.9 13.8 13.4 12.4*  HCT 42.0 41.0 41.8 40.4 37.0*  MCV 98.6  --  95.9 97.3 94.9  PLT 160  --  129* 121* 114*   Cardiac Enzymes: No results for input(s): "CKTOTAL", "CKMB", "CKMBINDEX", "TROPONINI" in the last 168 hours. BNP: Invalid input(s): "POCBNP" CBG: No results for input(s): "GLUCAP" in the last 168 hours. D-Dimer No results for input(s): "DDIMER" in the last 72 hours. Hgb A1c Recent Labs    05/04/23 0300  HGBA1C 5.6   Lipid Profile Recent Labs    05/04/23 0300  CHOL 87  HDL 45  LDLCALC 35   TRIG 36  CHOLHDL 1.9   Thyroid function studies No results for input(s): "TSH", "T4TOTAL", "T3FREE", "THYROIDAB" in the last 72 hours.  Invalid input(s): "FREET3" Anemia work up No results for input(s): "VITAMINB12", "FOLATE", "FERRITIN", "TIBC", "IRON", "RETICCTPCT" in the last 72 hours. Microbiology No results found for this or any previous visit (from the past 240 hour(s)).   Discharge Instructions:   Discharge Instructions     Ambulatory referral to Pulmonology   Complete by: As directed    Mass/nodules as well as findings to suggests Pulm HTN and possible interstitial lung disease   Reason for referral: Lung Mass/Lung Nodule   Discharge instructions   Complete by: As directed    Soft diet   Increase activity slowly   Complete by: As directed       Allergies as of 05/06/2023       Reactions   Penicillin G Rash        Medication List     TAKE these medications    ASPIRIN 81 PO Take 81 mg by mouth in the morning. What changed: Another medication with the same name was removed. Continue taking this medication, and follow the directions you see here.   bisacodyl 10 MG suppository Commonly known as: DULCOLAX Place 1 suppository (10 mg total) rectally daily as needed for moderate constipation.   doxycycline 100 MG tablet Commonly known as: VIBRA-TABS Take 100 mg by mouth 2 (two) times a week. Wednesday and Sunday  For rosacea   lisinopril 20 MG tablet Commonly known as: ZESTRIL Take 20 mg by mouth in the morning.   ondansetron 4 MG tablet Commonly known as: ZOFRAN Take 1 tablet (4 mg total) by mouth every 6 (six) hours as needed for nausea.   polyethylene glycol 17 g packet Commonly known as: MIRALAX / GLYCOLAX Take 17 g by mouth 2 (two) times daily.   rosuvastatin 20 MG tablet Commonly known as: CRESTOR Take 20 mg by mouth in the morning.   traMADol 50 MG tablet Commonly known as: ULTRAM Take 1 tablet (50 mg total) by mouth every 6 (six) hours  as needed for moderate pain.        Follow-up Information     Cleatis Polka., MD Follow up in 1 week(s).   Specialty: Internal Medicine Contact information: [address] [place] [address] [phone]                  Time coordinating discharge: 45 min  Signed:  Joseph Art DO  [facility] 05/06/2023, 1:40 PM

## 2023-05-06 NOTE — Plan of Care (Signed)
  Problem: Education: Goal: Knowledge of [place] [facility] Education information/materials will improve Outcome: Progressing   Problem: Education: Goal: Knowledge of General Education information will improve Description: Including pain rating scale, medication(s)/side effects and non-pharmacologic comfort measures Outcome: Progressing   Problem: Health Behavior/Discharge Planning: Goal: Ability to manage health-related needs will improve Outcome: Progressing   Problem: Clinical Measurements: Goal: Ability to maintain clinical measurements within normal limits will improve Outcome: Progressing Goal: Will remain free from infection Outcome: Progressing Goal: Diagnostic test results will improve Outcome: Progressing Goal: Respiratory complications will improve Outcome: Progressing Goal: Cardiovascular complication will be avoided Outcome: Progressing   Problem: Activity: Goal: Risk for activity intolerance will decrease Outcome: Progressing   Problem: Nutrition: Goal: Adequate nutrition will be maintained Outcome: Progressing   Problem: Coping: Goal: Level of anxiety will decrease Outcome: Progressing   Problem: Elimination: Goal: Will not experience complications related to bowel motility Outcome: Progressing Goal: Will not experience complications related to urinary retention Outcome: Progressing   Problem: Pain Managment: Goal: General experience of comfort will improve Outcome: Progressing   Problem: Safety: Goal: Ability to remain free from injury will improve Outcome: Progressing   Problem: Skin Integrity: Goal: Risk for impaired skin integrity will decrease Outcome: Progressing

## 2023-05-06 NOTE — Care Management Important Message (Signed)
Important Message  Patient Details  Name: Edward Frazier MRN: [id number] Date of Birth: [birth year]   Medicare Important Message Given:  Yes     [name] 05/06/2023, 1:18 PM

## 2023-05-17 DIAGNOSIS — R7301 Impaired fasting glucose: Secondary | ICD-10-CM | POA: Diagnosis not present

## 2023-05-17 DIAGNOSIS — K8020 Calculus of gallbladder without cholecystitis without obstruction: Secondary | ICD-10-CM | POA: Diagnosis not present

## 2023-05-17 DIAGNOSIS — I272 Pulmonary hypertension, unspecified: Secondary | ICD-10-CM | POA: Diagnosis not present

## 2023-05-17 DIAGNOSIS — K8500 Idiopathic acute pancreatitis without necrosis or infection: Secondary | ICD-10-CM | POA: Diagnosis not present

## 2023-05-17 DIAGNOSIS — I2510 Atherosclerotic heart disease of native coronary artery without angina pectoris: Secondary | ICD-10-CM | POA: Diagnosis not present

## 2023-05-17 DIAGNOSIS — K5790 Diverticulosis of intestine, part unspecified, without perforation or abscess without bleeding: Secondary | ICD-10-CM | POA: Diagnosis not present

## 2023-05-17 DIAGNOSIS — E785 Hyperlipidemia, unspecified: Secondary | ICD-10-CM | POA: Diagnosis not present

## 2023-05-17 DIAGNOSIS — I10 Essential (primary) hypertension: Secondary | ICD-10-CM | POA: Diagnosis not present

## 2023-05-17 DIAGNOSIS — R911 Solitary pulmonary nodule: Secondary | ICD-10-CM | POA: Diagnosis not present

## 2023-05-31 DIAGNOSIS — D692 Other nonthrombocytopenic purpura: Secondary | ICD-10-CM | POA: Diagnosis not present

## 2023-05-31 DIAGNOSIS — Z85828 Personal history of other malignant neoplasm of skin: Secondary | ICD-10-CM | POA: Diagnosis not present

## 2023-05-31 DIAGNOSIS — L570 Actinic keratosis: Secondary | ICD-10-CM | POA: Diagnosis not present

## 2023-05-31 DIAGNOSIS — D485 Neoplasm of uncertain behavior of skin: Secondary | ICD-10-CM | POA: Diagnosis not present

## 2023-05-31 DIAGNOSIS — D225 Melanocytic nevi of trunk: Secondary | ICD-10-CM | POA: Diagnosis not present

## 2023-05-31 DIAGNOSIS — L821 Other seborrheic keratosis: Secondary | ICD-10-CM | POA: Diagnosis not present

## 2023-05-31 DIAGNOSIS — C44629 Squamous cell carcinoma of skin of left upper limb, including shoulder: Secondary | ICD-10-CM | POA: Diagnosis not present

## 2023-06-14 DIAGNOSIS — R911 Solitary pulmonary nodule: Secondary | ICD-10-CM | POA: Diagnosis not present

## 2023-07-04 ENCOUNTER — Other Ambulatory Visit: Payer: Self-pay

## 2023-07-05 ENCOUNTER — Other Ambulatory Visit: Payer: Self-pay

## 2023-08-16 DIAGNOSIS — H43813 Vitreous degeneration, bilateral: Secondary | ICD-10-CM | POA: Diagnosis not present

## 2023-08-16 DIAGNOSIS — H2513 Age-related nuclear cataract, bilateral: Secondary | ICD-10-CM | POA: Diagnosis not present

## 2023-08-16 DIAGNOSIS — H04123 Dry eye syndrome of bilateral lacrimal glands: Secondary | ICD-10-CM | POA: Diagnosis not present

## 2023-08-16 DIAGNOSIS — H25013 Cortical age-related cataract, bilateral: Secondary | ICD-10-CM | POA: Diagnosis not present

## 2023-08-16 DIAGNOSIS — H353132 Nonexudative age-related macular degeneration, bilateral, intermediate dry stage: Secondary | ICD-10-CM | POA: Diagnosis not present

## 2023-08-16 DIAGNOSIS — H524 Presbyopia: Secondary | ICD-10-CM | POA: Diagnosis not present

## 2023-08-16 DIAGNOSIS — H5213 Myopia, bilateral: Secondary | ICD-10-CM | POA: Diagnosis not present

## 2023-08-29 ENCOUNTER — Ambulatory Visit: Payer: Medicare PPO | Admitting: Pulmonary Disease

## 2023-08-29 ENCOUNTER — Encounter: Payer: Self-pay | Admitting: Pulmonary Disease

## 2023-08-29 VITALS — BP 138/82 | HR 76 | Ht 67.0 in | Wt 170.6 lb

## 2023-08-29 DIAGNOSIS — R918 Other nonspecific abnormal finding of lung field: Secondary | ICD-10-CM | POA: Diagnosis not present

## 2023-08-29 NOTE — Patient Instructions (Signed)
Nice to meet you  We will repeat a CT scan in February 2025 to keep an eye on the small nodules, most likely these are benign but we will keep a close eye on that given your remote smoking history.  The scar on your lung is very mild and likely related to an old pneumonia.  I am not worried about this.  The pulmonary artery going into your lungs is a bit enlarged.  Sometimes we can see this due to fluid back up from the heart.  Given you have no symptoms of shortness of breath etc., I will send a message to your heart doctor and he can make recommendations if anything additionally needs to be done in terms of imaging or testing for the heart.  Return to clinic in 2 to 3 months after CT scan to discuss results with Dr. Judeth Horn

## 2023-08-29 NOTE — Progress Notes (Signed)
@Patient  ID: Edward Frazier, male    DOB: Apr 01, 1946, 77 y.o.   MRN: [id number]  Chief Complaint  Patient presents with   Consult    Pt denies any concerns    Referring provider: Lonia Blood, MD  HPI:   77 y.o. man whom are seen for evaluation of abnormal images on CT scan.  Discharge summary and H&P 04/2023 reviewed.  This hospitalization prompted referral.  Most recent cardiology note reviewed.  Patient presented to hospital 04/2023 with upper abdominal pain radiating to the back.  CTA dissection protocol was negative for dissection.  Lipase was elevated diagnosed with pancreatitis.  Treated with fluids and resolved improved.  CT of the chest with bilateral small 5 and 6 mm right lower lobe nodules, diffuse bronchial wall thickening, bilateral upper lobe peripheral scarring, very mild on my review and interpretation.  The interpretation also mentions of mild pulmonary artery enlargement.  This prompted referral, abnormal images.  He denies any dyspnea.  No cough.  Former smoker.  Quit in the 80s.  Started in the 70s.  About 15-pack-year history average 1 pack a day for about 15 years.  He seen by cardiology in the past.  Stress test negative in the past.  He endorses a few severe bouts of bronchitis and even pneumonia in the past.  None recently.  We discussed at length imaging findings.  The most likely etiology of the nodules and the scarring.  The role and rationale for repeat imaging in the future given his smoking history.  Discussed his enlarged pulmonary artery and what this could represent.  Assessment and plan as below.  Questionaires / Pulmonary Flowsheets:   ACT:      No data to display          MMRC:     No data to display          Epworth:      No data to display          Tests:   FENO:  No results found for: "NITRICOXIDE"  PFT:     No data to display          WALK:      No data to display          Imaging: Personally reviewed  and as per EMR and discussion in this note No results found.  Lab Results: Personally reviewed CBC    Component Value Date/Time   WBC 14.5 (H) 05/06/2023 0748   RBC 3.90 (L) 05/06/2023 0748   HGB 12.4 (L) 05/06/2023 0748   HCT 37.0 (L) 05/06/2023 0748   PLT 114 (L) 05/06/2023 0748   MCV 94.9 05/06/2023 0748   MCH 31.8 05/06/2023 0748   MCHC 33.5 05/06/2023 0748   RDW 12.8 05/06/2023 0748   LYMPHSABS 2.0 05/03/2023 0543   MONOABS 0.4 05/03/2023 0543   EOSABS 0.2 05/03/2023 0543   BASOSABS 0.0 05/03/2023 0543    BMET    Component Value Date/Time   NA 132 (L) 05/06/2023 0748   K 4.1 05/06/2023 0748   CL 101 05/06/2023 0748   CO2 23 05/06/2023 0748   GLUCOSE 69 (L) 05/06/2023 0748   BUN 11 05/06/2023 0748   CREATININE 0.68 05/06/2023 0748   CALCIUM 8.0 (L) 05/06/2023 0748   GFRNONAA >60 05/06/2023 0748    BNP No results found for: "BNP"  ProBNP No results found for: "PROBNP"  Specialty Problems   None   Allergies  Allergen Reactions  Penicillin G Rash    Immunization History  Administered Date(s) Administered   Influenza Split 07/29/2011, 08/02/2012, 08/08/2013, 08/26/2014   PFIZER(Purple Top)SARS-COV-2 Vaccination 10/03/2019, 10/24/2019   Td 06/14/2018   Zoster, Live 07/15/2010    Past Medical History:  Diagnosis Date   Hypertension    Polio     Tobacco History: Social History   Tobacco Use  Smoking Status Former  Smokeless Tobacco Never  Tobacco Comments   Quit tobacco 1984   Counseling given: Not Answered Tobacco comments: Quit tobacco 1984   Continue to not smoke  Outpatient Encounter Medications as of 08/29/2023  Medication Sig   ASPIRIN 81 PO Take 81 mg by mouth in the morning.   doxycycline (VIBRA-TABS) 100 MG tablet Take 100 mg by mouth 2 (two) times a week. Wednesday and Sunday  For rosacea   lisinopril (PRINIVIL,ZESTRIL) 20 MG tablet Take 10 mg by mouth in the morning.   rosuvastatin (CRESTOR) 20 MG tablet Take 20 mg by  mouth in the morning.   bisacodyl (DULCOLAX) 10 MG suppository Place 1 suppository (10 mg total) rectally daily as needed for moderate constipation. (Patient not taking: Reported on 08/29/2023)   ondansetron (ZOFRAN) 4 MG tablet Take 1 tablet (4 mg total) by mouth every 6 (six) hours as needed for nausea. (Patient not taking: Reported on 08/29/2023)   polyethylene glycol (MIRALAX / GLYCOLAX) 17 g packet Take 17 g by mouth 2 (two) times daily. (Patient not taking: Reported on 08/29/2023)   traMADol (ULTRAM) 50 MG tablet Take 1 tablet (50 mg total) by mouth every 6 (six) hours as needed for moderate pain. (Patient not taking: Reported on 08/29/2023)   No facility-administered encounter medications on file as of 08/29/2023.     Review of Systems  Review of Systems  No chest pain with exertion.  No orthopnea or PND.  Comprehensive review of systems otherwise negative. Physical Exam  BP 138/82   Pulse 76   Ht 5\' 7"  (1.702 m)   Wt 170 lb 9.6 oz (77.4 kg)   SpO2 98%   BMI 26.72 kg/m   Wt Readings from Last 5 Encounters:  08/29/23 170 lb 9.6 oz (77.4 kg)  05/03/23 178 lb 9.2 oz (81 kg)  01/28/23 178 lb 9.6 oz (81 kg)  01/01/22 170 lb 9.6 oz (77.4 kg)  01/06/21 166 lb 3.2 oz (75.4 kg)    BMI Readings from Last 5 Encounters:  08/29/23 26.72 kg/m  05/03/23 27.97 kg/m  01/28/23 27.97 kg/m  01/01/22 26.72 kg/m  01/06/21 26.03 kg/m     Physical Exam General: Sitting in chair, in no distress Eyes: EOMI, no icterus Neck: Supple, no JVP Pulmonary: Clear, normal work of breathing Cardiovascular: Warm, no edema, regular rate and rhythm Abdomen: Nondistended bowel sounds present MSK: No synovitis, no joint effusion Neuro: Normal gait, no weakness Psych: Normal mood, full affect   Assessment & Plan:   Pulmonary nodules: 2 in the right lower lobe largest 6 mm.  He has a remote smoking history about 15-pack-year quit in the 80s.  With a smoking history, with deemed him high risk.   Repeat CT scan recommended in 3 to 8-month interval from 04/2023.  Repeat CT scan February 2025, 41-month interval ordered.  Discussed in detail with patient.  He expressed understanding.  Lung scarring: Mild upper lobe peripheral.  Suspect related to infection, postinfectious scarring.  He endorses history of bad bronchitis and pneumonia in the past.  He does not handle birds.  No occupational exposures  to suggest hypersensitive pneumonitis although this is on the differential.  This clearly appears fibrotic and no evidence of ongoing inflammation.  I recommend no further workup unless symptoms develop.  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  He is asymptomatic denies dyspnea.  Will message cardiologist for further evaluation if any additional imaging, echocardiogram is warranted.  Discussed with patient and he agreed with this after shared decision making.   Return in about 3 months (around 11/27/2023) for f/u Dr. Judeth Horn, after CT scan.   Karren Burly, MD 08/29/2023   This appointment required 61 minutes of patient care (this includes precharting, chart review, review of results, face-to-face care, etc.).

## 2023-09-23 ENCOUNTER — Encounter: Payer: Self-pay | Admitting: Pulmonary Disease

## 2023-10-24 DIAGNOSIS — H2512 Age-related nuclear cataract, left eye: Secondary | ICD-10-CM | POA: Diagnosis not present

## 2023-10-24 DIAGNOSIS — H25012 Cortical age-related cataract, left eye: Secondary | ICD-10-CM | POA: Diagnosis not present

## 2023-10-24 DIAGNOSIS — H268 Other specified cataract: Secondary | ICD-10-CM | POA: Diagnosis not present

## 2023-10-24 DIAGNOSIS — H25812 Combined forms of age-related cataract, left eye: Secondary | ICD-10-CM | POA: Diagnosis not present

## 2023-10-24 DIAGNOSIS — Z961 Presence of intraocular lens: Secondary | ICD-10-CM | POA: Diagnosis not present

## 2023-10-25 ENCOUNTER — Ambulatory Visit
Admission: RE | Admit: 2023-10-25 | Discharge: 2023-10-25 | Disposition: A | Discharge status: Home / Self Care | Payer: Medicare PPO | Source: Ambulatory Visit | Attending: Pulmonary Disease | Admitting: Pulmonary Disease

## 2023-10-25 DIAGNOSIS — I2510 Atherosclerotic heart disease of native coronary artery without angina pectoris: Secondary | ICD-10-CM | POA: Diagnosis not present

## 2023-10-25 DIAGNOSIS — R918 Other nonspecific abnormal finding of lung field: Secondary | ICD-10-CM | POA: Diagnosis not present

## 2023-10-25 DIAGNOSIS — J439 Emphysema, unspecified: Secondary | ICD-10-CM | POA: Diagnosis not present

## 2023-11-07 ENCOUNTER — Encounter: Payer: Self-pay | Admitting: Pulmonary Disease

## 2023-11-07 NOTE — Progress Notes (Signed)
 CT scan stable, stable chronic parenchymal changes. Lung nodules stable.

## 2023-11-24 ENCOUNTER — Encounter: Payer: Self-pay | Admitting: Pulmonary Disease

## 2023-11-24 ENCOUNTER — Ambulatory Visit: Payer: Medicare PPO | Admitting: Pulmonary Disease

## 2023-11-24 VITALS — BP 150/75 | HR 68 | Ht 67.0 in | Wt 170.6 lb

## 2023-11-24 DIAGNOSIS — R911 Solitary pulmonary nodule: Secondary | ICD-10-CM

## 2023-11-24 DIAGNOSIS — Z87891 Personal history of nicotine dependence: Secondary | ICD-10-CM

## 2023-11-24 NOTE — Patient Instructions (Signed)
 Nice to see you again  CT scan is stable this is great news  Will repeat a in 6 months from the prior scan, in August, just to be cautious  Return to clinic in 6 months or sooner as needed with Dr. Judeth Horn, after CT scan

## 2023-11-24 NOTE — Progress Notes (Signed)
 @Patient  ID: Edward Frazier, male    DOB: [birth year], 78 y.o.   MRN: [id number]  Chief Complaint  Patient presents with   Follow-up    Referring provider: Cleatis Polka., MD  HPI:   78 y.o. man whom are seen for evaluation of abnormal images on CT scan.    Return for routine follow-up.  Stable.  Doing well.  Repeat CT scan in interim, 41-month follow-up for the pulm nodules performed, my review interpretation is clear lungs stable nodules, stable upper lobe peripheral scarring.  We discussed results in detail.  We discussed plan for repeat scan based on Fleischner criteria.  He expressed understanding.  HPI at initial visit:  Patient presented to hospital 04/2023 with upper abdominal pain radiating to the back.  CTA dissection protocol was negative for dissection.  Lipase was elevated diagnosed with pancreatitis.  Treated with fluids and resolved improved.  CT of the chest with bilateral small 5 and 6 mm right lower lobe nodules, diffuse bronchial wall thickening, bilateral upper lobe peripheral scarring, very mild on my review and interpretation.  The interpretation also mentions of mild pulmonary artery enlargement.  This prompted referral, abnormal images.  He denies any dyspnea.  No cough.  Former smoker.  Quit in the 80s.  Started in the 70s.  About 15-pack-year history average 1 pack a day for about 15 years.  He seen by cardiology in the past.  Stress test negative in the past.  He endorses a few severe bouts of bronchitis and even pneumonia in the past.  None recently.  We discussed at length imaging findings.  The most likely etiology of the nodules and the scarring.  The role and rationale for repeat imaging in the future given his smoking history.  Discussed his enlarged pulmonary artery and what this could represent.  Assessment and plan as below.  Questionaires / Pulmonary Flowsheets:   ACT:      No data to display          MMRC:     No data to display           Epworth:      No data to display          Tests:   FENO:  No results found for: "NITRICOXIDE"  PFT:     No data to display          WALK:      No data to display          Imaging: Personally reviewed and as per EMR and discussion in this note No results found.  Lab Results: Personally reviewed CBC    Component Value Date/Time   WBC 14.5 (H) 05/06/2023 0748   RBC 3.90 (L) 05/06/2023 0748   HGB 12.4 (L) 05/06/2023 0748   HCT 37.0 (L) 05/06/2023 0748   PLT 114 (L) 05/06/2023 0748   MCV 94.9 05/06/2023 0748   MCH 31.8 05/06/2023 0748   MCHC 33.5 05/06/2023 0748   RDW 12.8 05/06/2023 0748   LYMPHSABS 2.0 05/03/2023 0543   MONOABS 0.4 05/03/2023 0543   EOSABS 0.2 05/03/2023 0543   BASOSABS 0.0 05/03/2023 0543    BMET    Component Value Date/Time   NA 132 (L) 05/06/2023 0748   K 4.1 05/06/2023 0748   CL 101 05/06/2023 0748   CO2 23 05/06/2023 0748   GLUCOSE 69 (L) 05/06/2023 0748   BUN 11 05/06/2023 0748   CREATININE 0.68 05/06/2023 0748   CALCIUM  8.0 (L) 05/06/2023 0748   GFRNONAA >60 05/06/2023 0748    BNP No results found for: "BNP"  ProBNP No results found for: "PROBNP"  Specialty Problems   None   Allergies  Allergen Reactions   Penicillin G Rash    Immunization History  Administered Date(s) Administered   Influenza Split 07/29/2011, 08/02/2012, 08/08/2013, 08/26/2014   PFIZER(Purple Top)SARS-COV-2 Vaccination 10/03/2019, 10/24/2019   Td 06/14/2018   Zoster, Live 07/15/2010    Past Medical History:  Diagnosis Date   Hypertension    Polio     Tobacco History: Social History   Tobacco Use  Smoking Status Former  Smokeless Tobacco Never  Tobacco Comments   Quit tobacco 1984   Counseling given: Not Answered Tobacco comments: Quit tobacco 1984   Continue to not smoke  Outpatient Encounter Medications as of 11/24/2023  Medication Sig   ASPIRIN 81 PO Take 81 mg by mouth in the morning.   doxycycline  (VIBRA-TABS) 100 MG tablet Take 100 mg by mouth 2 (two) times a week. Wednesday and Sunday  For rosacea   lisinopril (PRINIVIL,ZESTRIL) 20 MG tablet Take 10 mg by mouth in the morning.   rosuvastatin (CRESTOR) 20 MG tablet Take 20 mg by mouth in the morning.   bisacodyl (DULCOLAX) 10 MG suppository Place 1 suppository (10 mg total) rectally daily as needed for moderate constipation. (Patient not taking: Reported on 11/24/2023)   ondansetron (ZOFRAN) 4 MG tablet Take 1 tablet (4 mg total) by mouth every 6 (six) hours as needed for nausea. (Patient not taking: Reported on 11/24/2023)   polyethylene glycol (MIRALAX / GLYCOLAX) 17 g packet Take 17 g by mouth 2 (two) times daily. (Patient not taking: Reported on 11/24/2023)   traMADol (ULTRAM) 50 MG tablet Take 1 tablet (50 mg total) by mouth every 6 (six) hours as needed for moderate pain. (Patient not taking: Reported on 11/24/2023)   No facility-administered encounter medications on file as of 11/24/2023.     Review of Systems  Review of Systems  N/a Physical Exam  BP (!) 150/75 (BP Location: Left Arm, Patient Position: Sitting, Cuff Size: Normal)   Pulse 68   Ht 5\' 7"  (1.702 m)   Wt 170 lb 9.6 oz (77.4 kg)   SpO2 97%   BMI 26.72 kg/m   Wt Readings from Last 5 Encounters:  11/24/23 170 lb 9.6 oz (77.4 kg)  08/29/23 170 lb 9.6 oz (77.4 kg)  05/03/23 178 lb 9.2 oz (81 kg)  01/28/23 178 lb 9.6 oz (81 kg)  01/01/22 170 lb 9.6 oz (77.4 kg)    BMI Readings from Last 5 Encounters:  11/24/23 26.72 kg/m  08/29/23 26.72 kg/m  05/03/23 27.97 kg/m  01/28/23 27.97 kg/m  01/01/22 26.72 kg/m     Physical Exam General: Sitting in chair, in no distress Eyes: EOMI, no icterus Neck: Supple, no JVP Pulmonary: Clear, normal work of breathing Cardiovascular: Warm, no edema, regular rate and rhythm Abdomen: Nondistended bowel sounds present MSK: No synovitis, no joint effusion Neuro: Normal gait, no weakness Psych: Normal mood, full  affect   Assessment & Plan:   Pulmonary nodules: 2 in the right lower lobe largest 6 mm.  He has a remote smoking history about 15-pack-year quit in the 80s.  With a smoking history, with deemed him high risk.  Repeat CT scan recommended in 3 to 73-month interval from 04/2023.  Repeat CT scan February 2025, 35-month interval is stable on my review and interpretation.  Plan repeat scan in  6 months, 1 year total.  If stable at that point time 1 more further scan 6-12 months thereafter per Fleischner criteria.  Discussed in detail with patient.  Lung scarring: Mild upper lobe peripheral.  Suspect related to infection, postinfectious scarring.  He endorses history of bad bronchitis and pneumonia in the past.  He does not handle birds.  No occupational exposures to suggest hypersensitive pneumonitis although this is on the differential.  This clearly appears fibrotic and no evidence of ongoing inflammation.  Asymptomatic, no dyspnea etc.  He remains active.  I recommend no further workup unless symptoms develop.   Return in about 6 months (around 05/26/2024) for f/u Dr. Judeth Horn, after CT scan.   Karren Burly, MD 11/24/2023

## 2023-11-29 DIAGNOSIS — D692 Other nonthrombocytopenic purpura: Secondary | ICD-10-CM | POA: Diagnosis not present

## 2023-11-29 DIAGNOSIS — L570 Actinic keratosis: Secondary | ICD-10-CM | POA: Diagnosis not present

## 2023-11-29 DIAGNOSIS — D485 Neoplasm of uncertain behavior of skin: Secondary | ICD-10-CM | POA: Diagnosis not present

## 2023-11-29 DIAGNOSIS — L821 Other seborrheic keratosis: Secondary | ICD-10-CM | POA: Diagnosis not present

## 2023-11-29 DIAGNOSIS — D045 Carcinoma in situ of skin of trunk: Secondary | ICD-10-CM | POA: Diagnosis not present

## 2023-11-29 DIAGNOSIS — Z85828 Personal history of other malignant neoplasm of skin: Secondary | ICD-10-CM | POA: Diagnosis not present

## 2024-01-03 DIAGNOSIS — E785 Hyperlipidemia, unspecified: Secondary | ICD-10-CM | POA: Diagnosis not present

## 2024-01-03 DIAGNOSIS — I10 Essential (primary) hypertension: Secondary | ICD-10-CM | POA: Diagnosis not present

## 2024-01-03 DIAGNOSIS — Z125 Encounter for screening for malignant neoplasm of prostate: Secondary | ICD-10-CM | POA: Diagnosis not present

## 2024-01-03 DIAGNOSIS — I2510 Atherosclerotic heart disease of native coronary artery without angina pectoris: Secondary | ICD-10-CM | POA: Diagnosis not present

## 2024-01-03 DIAGNOSIS — E7849 Other hyperlipidemia: Secondary | ICD-10-CM | POA: Diagnosis not present

## 2024-01-10 DIAGNOSIS — K8020 Calculus of gallbladder without cholecystitis without obstruction: Secondary | ICD-10-CM | POA: Diagnosis not present

## 2024-01-10 DIAGNOSIS — Z8612 Personal history of poliomyelitis: Secondary | ICD-10-CM | POA: Diagnosis not present

## 2024-01-10 DIAGNOSIS — Z8719 Personal history of other diseases of the digestive system: Secondary | ICD-10-CM | POA: Diagnosis not present

## 2024-01-10 DIAGNOSIS — R7301 Impaired fasting glucose: Secondary | ICD-10-CM | POA: Diagnosis not present

## 2024-01-10 DIAGNOSIS — G8190 Hemiplegia, unspecified affecting unspecified side: Secondary | ICD-10-CM | POA: Diagnosis not present

## 2024-01-10 DIAGNOSIS — Z1339 Encounter for screening examination for other mental health and behavioral disorders: Secondary | ICD-10-CM | POA: Diagnosis not present

## 2024-01-10 DIAGNOSIS — J8410 Pulmonary fibrosis, unspecified: Secondary | ICD-10-CM | POA: Diagnosis not present

## 2024-01-10 DIAGNOSIS — Z1331 Encounter for screening for depression: Secondary | ICD-10-CM | POA: Diagnosis not present

## 2024-01-10 DIAGNOSIS — R82998 Other abnormal findings in urine: Secondary | ICD-10-CM | POA: Diagnosis not present

## 2024-01-10 DIAGNOSIS — E785 Hyperlipidemia, unspecified: Secondary | ICD-10-CM | POA: Diagnosis not present

## 2024-01-10 DIAGNOSIS — I2510 Atherosclerotic heart disease of native coronary artery without angina pectoris: Secondary | ICD-10-CM | POA: Diagnosis not present

## 2024-01-10 DIAGNOSIS — J432 Centrilobular emphysema: Secondary | ICD-10-CM | POA: Diagnosis not present

## 2024-01-10 DIAGNOSIS — Z8589 Personal history of malignant neoplasm of other organs and systems: Secondary | ICD-10-CM | POA: Diagnosis not present

## 2024-01-10 DIAGNOSIS — Z0000 Encounter for general adult medical examination without abnormal findings: Secondary | ICD-10-CM | POA: Diagnosis not present

## 2024-01-10 DIAGNOSIS — I10 Essential (primary) hypertension: Secondary | ICD-10-CM | POA: Diagnosis not present

## 2024-02-02 DIAGNOSIS — R0989 Other specified symptoms and signs involving the circulatory and respiratory systems: Secondary | ICD-10-CM | POA: Insufficient documentation

## 2024-02-02 NOTE — Progress Notes (Signed)
  Cardiology Office Note:   Date:  02/03/2024  ID:  Edward, Frazier [birth year], MRN [id number] PCP: Jeannine Milroy., MD  [facility] Providers Cardiologist:  None {  History of Present Illness:   Edward Frazier is a 78 y.o. male  who presents for evaluation of elevated coronary calcium .  He has no history of coronary disease but did have some mild dyslipidemia.  He was sent for a coronary calcium  score.  This came back elevated with a count of 874 in the left main, LAD 305 in the left circumflex 79.  Total was 1258 which put him at the 83rd percentile for his age.  He had a negative POET (Plain Old Exercise Treadmill) in March 2023.    Since I last saw him he has done well.  He stays very active.  He denies any cardiovascular symptoms.  He is limited because of his history of polio but he still gets around well. The patient denies any new symptoms such as chest discomfort, neck or arm discomfort. There has been no new shortness of breath, PND or orthopnea. There have been no reported palpitations, presyncope or syncope.   ROS: As stated in the HPI and negative for all other systems.  Studies Reviewed:    EKG:     Sinus rhythm, rate 69, axis within normal limits, intervals within normal limits, no acute ST-T wave changes.  Risk Assessment/Calculations:     Physical Exam:   VS:  BP 128/74   Pulse 72   Ht 5\' 7"  (1.702 m)   Wt 169 lb (76.7 kg)   SpO2 98%   BMI 26.47 kg/m    Wt Readings from Last 3 Encounters:  02/03/24 169 lb (76.7 kg)  11/24/23 170 lb 9.6 oz (77.4 kg)  08/29/23 170 lb 9.6 oz (77.4 kg)     GEN: Well nourished, well developed in no acute distress NECK: No JVD; No carotid bruits CARDIAC: RRR, no murmurs, rubs, gallops RESPIRATORY:  Clear to auscultation without rales, wheezing or rhonchi  ABDOMEN: Soft, non-tender, non-distended EXTREMITIES:   Right leg muscle wasting, mild leg edema.  ASSESSMENT AND PLAN:   CAD:  The patient has no new sypmtoms.   No further cardiovascular testing is indicated.  We will continue with aggressive risk reduction and meds as listed.   DYSLIPIDEMIA:  LDL was 33 with an HDL of 43.  No change in therapy.     BRUIT: He had mild coronary plaque.  No further imaging.  Follow up with me in 2 years.  Signed, Eilleen Grates, MD

## 2024-02-03 ENCOUNTER — Ambulatory Visit: Attending: Cardiology | Admitting: Cardiology

## 2024-02-03 ENCOUNTER — Encounter: Payer: Self-pay | Admitting: Cardiology

## 2024-02-03 VITALS — BP 128/74 | HR 72 | Ht 67.0 in | Wt 169.0 lb

## 2024-02-03 DIAGNOSIS — I2510 Atherosclerotic heart disease of native coronary artery without angina pectoris: Secondary | ICD-10-CM | POA: Diagnosis not present

## 2024-02-03 DIAGNOSIS — R0989 Other specified symptoms and signs involving the circulatory and respiratory systems: Secondary | ICD-10-CM

## 2024-02-03 DIAGNOSIS — E785 Hyperlipidemia, unspecified: Secondary | ICD-10-CM | POA: Diagnosis not present

## 2024-02-03 NOTE — Patient Instructions (Signed)
 Medication Instructions:  The current medical regimen is effective;  continue present plan and medications.  *If you need a refill on your cardiac medications before your next appointment, please call your pharmacy*  Follow-Up: At [facility], you and your health needs are our priority.  As part of our continuing mission to provide you with exceptional heart care, our providers are all part of one team.  This team includes your primary Cardiologist (physician) and Advanced Practice Providers or APPs (Physician Assistants and Nurse Practitioners) who all work together to provide you with the care you need, when you need it.  Your next appointment:   2 year(s)  Provider:   Dr Eilleen Grates    We recommend signing up for the patient portal called "MyChart".  Sign up information is provided on this After Visit Summary.  MyChart is used to connect with patients for Virtual Visits (Telemedicine).  Patients are able to view lab/test results, encounter notes, upcoming appointments, etc.  Non-urgent messages can be sent to your provider as well.   To learn more about what you can do with MyChart, go to [email or web address].    Thank you for choosing [facility]!!

## 2024-03-15 DIAGNOSIS — M25562 Pain in left knee: Secondary | ICD-10-CM | POA: Diagnosis not present

## 2024-04-02 DIAGNOSIS — M1712 Unilateral primary osteoarthritis, left knee: Secondary | ICD-10-CM | POA: Diagnosis not present

## 2024-04-02 DIAGNOSIS — M6281 Muscle weakness (generalized): Secondary | ICD-10-CM | POA: Diagnosis not present

## 2024-04-03 ENCOUNTER — Encounter: Payer: Self-pay | Admitting: Pulmonary Disease

## 2024-04-05 DIAGNOSIS — M6281 Muscle weakness (generalized): Secondary | ICD-10-CM | POA: Diagnosis not present

## 2024-04-05 DIAGNOSIS — M1712 Unilateral primary osteoarthritis, left knee: Secondary | ICD-10-CM | POA: Diagnosis not present

## 2024-04-11 DIAGNOSIS — M6281 Muscle weakness (generalized): Secondary | ICD-10-CM | POA: Diagnosis not present

## 2024-04-11 DIAGNOSIS — M1712 Unilateral primary osteoarthritis, left knee: Secondary | ICD-10-CM | POA: Diagnosis not present

## 2024-04-13 DIAGNOSIS — M1712 Unilateral primary osteoarthritis, left knee: Secondary | ICD-10-CM | POA: Diagnosis not present

## 2024-04-13 DIAGNOSIS — M6281 Muscle weakness (generalized): Secondary | ICD-10-CM | POA: Diagnosis not present

## 2024-04-17 ENCOUNTER — Other Ambulatory Visit

## 2024-04-24 DIAGNOSIS — M1712 Unilateral primary osteoarthritis, left knee: Secondary | ICD-10-CM | POA: Diagnosis not present

## 2024-04-24 DIAGNOSIS — M6281 Muscle weakness (generalized): Secondary | ICD-10-CM | POA: Diagnosis not present

## 2024-05-15 DIAGNOSIS — M6281 Muscle weakness (generalized): Secondary | ICD-10-CM | POA: Diagnosis not present

## 2024-05-15 DIAGNOSIS — M1712 Unilateral primary osteoarthritis, left knee: Secondary | ICD-10-CM | POA: Diagnosis not present

## 2024-05-16 DIAGNOSIS — L821 Other seborrheic keratosis: Secondary | ICD-10-CM | POA: Diagnosis not present

## 2024-05-16 DIAGNOSIS — L570 Actinic keratosis: Secondary | ICD-10-CM | POA: Diagnosis not present

## 2024-05-16 DIAGNOSIS — D692 Other nonthrombocytopenic purpura: Secondary | ICD-10-CM | POA: Diagnosis not present

## 2024-05-16 DIAGNOSIS — D485 Neoplasm of uncertain behavior of skin: Secondary | ICD-10-CM | POA: Diagnosis not present

## 2024-05-16 DIAGNOSIS — D045 Carcinoma in situ of skin of trunk: Secondary | ICD-10-CM | POA: Diagnosis not present

## 2024-05-16 DIAGNOSIS — Z85828 Personal history of other malignant neoplasm of skin: Secondary | ICD-10-CM | POA: Diagnosis not present

## 2024-05-16 DIAGNOSIS — L905 Scar conditions and fibrosis of skin: Secondary | ICD-10-CM | POA: Diagnosis not present

## 2024-05-17 DIAGNOSIS — M6281 Muscle weakness (generalized): Secondary | ICD-10-CM | POA: Diagnosis not present

## 2024-05-17 DIAGNOSIS — M1712 Unilateral primary osteoarthritis, left knee: Secondary | ICD-10-CM | POA: Diagnosis not present

## 2024-05-22 DIAGNOSIS — M1712 Unilateral primary osteoarthritis, left knee: Secondary | ICD-10-CM | POA: Diagnosis not present

## 2024-05-22 DIAGNOSIS — M6281 Muscle weakness (generalized): Secondary | ICD-10-CM | POA: Diagnosis not present

## 2024-05-24 DIAGNOSIS — M6281 Muscle weakness (generalized): Secondary | ICD-10-CM | POA: Diagnosis not present

## 2024-05-24 DIAGNOSIS — M1712 Unilateral primary osteoarthritis, left knee: Secondary | ICD-10-CM | POA: Diagnosis not present

## 2024-05-30 DIAGNOSIS — M6281 Muscle weakness (generalized): Secondary | ICD-10-CM | POA: Diagnosis not present

## 2024-05-30 DIAGNOSIS — M1712 Unilateral primary osteoarthritis, left knee: Secondary | ICD-10-CM | POA: Diagnosis not present

## 2024-05-31 ENCOUNTER — Ambulatory Visit: Admitting: Podiatry

## 2024-05-31 ENCOUNTER — Encounter: Payer: Self-pay | Admitting: Podiatry

## 2024-05-31 DIAGNOSIS — M79676 Pain in unspecified toe(s): Secondary | ICD-10-CM

## 2024-05-31 DIAGNOSIS — B351 Tinea unguium: Secondary | ICD-10-CM | POA: Diagnosis not present

## 2024-05-31 NOTE — Progress Notes (Signed)
  Subjective:  Patient ID: Edward Frazier, male    DOB: Jan 20, 1946,  MRN: [id number]  78 y.o. male presents to clinic with  preulcerative lesion(s) right second digit and painful mycotic toenails that limit ambulation. Painful toenails interfere with ambulation. Aggravating factors include wearing enclosed shoe gear. Pain is relieved with periodic professional debridement. Painful preulcerative lesion(s) is/are aggravated when weightbearing with and without shoegear. Pain is relieved with periodic professional debridement.   Patient states he has seen Dr. Loreda in the past.  Chief Complaint  Patient presents with   [facility]    Rm1 routine foot care / Dr. Elsie gentry last visit April 2025/ A1c 5.6   New problem(s): None   PCP is gentry Edward Frazier., MD.  Allergies  Allergen Reactions   Penicillin G Rash    Review of Systems: Negative except as noted in the HPI.   Objective:  Edward Frazier is a pleasant 78 y.o. male WD, WN in NAD. AAO x 3.  Vascular Examination: Vascular status intact b/l with palpable pedal pulses. Pedal hair sparse. CFT immediate b/l. No edema. No pain with calf compression b/l. Skin temperature gradient WNL b/l.   Neurological Examination: Sensation grossly intact b/l with 10 gram monofilament. Vibratory sensation intact b/l.   Dermatological Examination: Pedal skin with normal turgor, texture and tone b/l. Toenails 1-5 b/l thick, discolored, elongated with subungual debris and pain on dorsal palpation.  Preulcerative lesion noted distal tip of right 2nd toe. There is visible subdermal hemorrhage. There is no surrounding erythema, no edema, no drainage, no odor, no fluctuance.  Musculoskeletal Examination: Muscle strength 5/5 to LLE. Muscle wasting noted RLE; weakness noted RLE secondary to polio. Hammertoe deformity noted 2-5 b/l.  Radiographs: None  Assessment:   1. Pain due to onychomycosis of toenail    Plan:  -Patient was evaluated today. All  questions/concerns addressed on today's visit. -Examined patient. -Patient to continue soft, supportive shoe gear daily. -Toenails 1-5 b/l were debrided in length and girth with sterile nail nippers and dremel without iatrogenic bleeding.  -Preulcerative lesion pared distal tip of right 2nd toe utilizing sterile scalpel blade. Total number pared=1. -Continue toe crest to right foot daily. Remove every evening. -Patient/POA to call should there be question/concern in the interim.  Return in about 4 months (around 09/30/2024).  [name], DPM      [facility] LOCATION: [address] [place] [address]                   Office [phone]   [facility] LOCATION: [address] [place], [place] [address] Office [phone]

## 2024-06-01 DIAGNOSIS — M6281 Muscle weakness (generalized): Secondary | ICD-10-CM | POA: Diagnosis not present

## 2024-06-01 DIAGNOSIS — M1712 Unilateral primary osteoarthritis, left knee: Secondary | ICD-10-CM | POA: Diagnosis not present

## 2024-06-11 DIAGNOSIS — M1712 Unilateral primary osteoarthritis, left knee: Secondary | ICD-10-CM | POA: Diagnosis not present

## 2024-06-11 DIAGNOSIS — M6281 Muscle weakness (generalized): Secondary | ICD-10-CM | POA: Diagnosis not present

## 2024-06-14 DIAGNOSIS — M6281 Muscle weakness (generalized): Secondary | ICD-10-CM | POA: Diagnosis not present

## 2024-06-14 DIAGNOSIS — M1712 Unilateral primary osteoarthritis, left knee: Secondary | ICD-10-CM | POA: Diagnosis not present

## 2024-06-18 ENCOUNTER — Telehealth: Payer: Self-pay | Admitting: Pulmonary Disease

## 2024-06-18 NOTE — Telephone Encounter (Signed)
 Copied from CRM [id number]. Topic: Appointments - Appointment Cancel/Reschedule >> Jun 18, 2024  8:36 AM Devaughn RAMAN wrote: Patient is calling to cancel or reschedule an appointment. Patient cancelled his CT scan and would like to know if Dr.Hunsucker would still like to see him although he is not getting his CT scan anymore, patient would like a follow up call back regarding this.

## 2024-06-18 NOTE — Telephone Encounter (Signed)
 If patient has decided to not get CT scan then no need for an appointment. I recommend the CT scan be performed or rescheduled but if he has decided to get no further CT scans then no need for follow up.

## 2024-06-18 NOTE — Telephone Encounter (Signed)
 ATC x1. No answer, left voicemail to call back.

## 2024-06-21 ENCOUNTER — Ambulatory Visit: Admitting: Pulmonary Disease

## 2024-06-22 DIAGNOSIS — M1711 Unilateral primary osteoarthritis, right knee: Secondary | ICD-10-CM | POA: Diagnosis not present

## 2024-06-22 DIAGNOSIS — R531 Weakness: Secondary | ICD-10-CM | POA: Insufficient documentation

## 2024-06-26 DIAGNOSIS — R531 Weakness: Secondary | ICD-10-CM | POA: Diagnosis not present

## 2024-06-26 DIAGNOSIS — M1711 Unilateral primary osteoarthritis, right knee: Secondary | ICD-10-CM | POA: Diagnosis not present

## 2024-06-26 NOTE — Telephone Encounter (Signed)
 I called and spoke with patient, he states he had already contacted the office to cancel the appointment.  I let him know if he decided to have the scan to call the office and let us  know.  He verbalized understanding.  Nothing further needed.

## 2024-07-04 DIAGNOSIS — M1711 Unilateral primary osteoarthritis, right knee: Secondary | ICD-10-CM | POA: Diagnosis not present

## 2024-07-04 DIAGNOSIS — R531 Weakness: Secondary | ICD-10-CM | POA: Diagnosis not present

## 2024-07-06 DIAGNOSIS — R531 Weakness: Secondary | ICD-10-CM | POA: Diagnosis not present

## 2024-07-06 DIAGNOSIS — M1711 Unilateral primary osteoarthritis, right knee: Secondary | ICD-10-CM | POA: Diagnosis not present

## 2024-07-19 DIAGNOSIS — M1711 Unilateral primary osteoarthritis, right knee: Secondary | ICD-10-CM | POA: Diagnosis not present

## 2024-07-19 DIAGNOSIS — R531 Weakness: Secondary | ICD-10-CM | POA: Diagnosis not present

## 2024-07-24 DIAGNOSIS — M1711 Unilateral primary osteoarthritis, right knee: Secondary | ICD-10-CM | POA: Diagnosis not present

## 2024-07-24 DIAGNOSIS — R531 Weakness: Secondary | ICD-10-CM | POA: Diagnosis not present

## 2024-07-26 DIAGNOSIS — M1711 Unilateral primary osteoarthritis, right knee: Secondary | ICD-10-CM | POA: Diagnosis not present

## 2024-07-26 DIAGNOSIS — R531 Weakness: Secondary | ICD-10-CM | POA: Diagnosis not present

## 2024-07-31 DIAGNOSIS — R531 Weakness: Secondary | ICD-10-CM | POA: Diagnosis not present

## 2024-07-31 DIAGNOSIS — M1711 Unilateral primary osteoarthritis, right knee: Secondary | ICD-10-CM | POA: Diagnosis not present

## 2024-08-02 DIAGNOSIS — R531 Weakness: Secondary | ICD-10-CM | POA: Diagnosis not present

## 2024-08-02 DIAGNOSIS — M1711 Unilateral primary osteoarthritis, right knee: Secondary | ICD-10-CM | POA: Diagnosis not present

## 2024-08-16 DIAGNOSIS — M1711 Unilateral primary osteoarthritis, right knee: Secondary | ICD-10-CM | POA: Diagnosis not present

## 2024-08-16 DIAGNOSIS — R531 Weakness: Secondary | ICD-10-CM | POA: Diagnosis not present

## 2024-10-04 ENCOUNTER — Encounter: Payer: Self-pay | Admitting: Podiatry

## 2024-10-04 ENCOUNTER — Ambulatory Visit: Admitting: Podiatry

## 2024-10-04 DIAGNOSIS — D696 Thrombocytopenia, unspecified: Secondary | ICD-10-CM | POA: Insufficient documentation

## 2024-10-04 DIAGNOSIS — Z8589 Personal history of malignant neoplasm of other organs and systems: Secondary | ICD-10-CM | POA: Insufficient documentation

## 2024-10-04 DIAGNOSIS — I272 Pulmonary hypertension, unspecified: Secondary | ICD-10-CM | POA: Insufficient documentation

## 2024-10-04 DIAGNOSIS — M79676 Pain in unspecified toe(s): Secondary | ICD-10-CM

## 2024-10-04 DIAGNOSIS — K5730 Diverticulosis of large intestine without perforation or abscess without bleeding: Secondary | ICD-10-CM | POA: Insufficient documentation

## 2024-10-04 DIAGNOSIS — K5790 Diverticulosis of intestine, part unspecified, without perforation or abscess without bleeding: Secondary | ICD-10-CM | POA: Insufficient documentation

## 2024-10-04 DIAGNOSIS — D72829 Elevated white blood cell count, unspecified: Secondary | ICD-10-CM | POA: Insufficient documentation

## 2024-10-04 DIAGNOSIS — B351 Tinea unguium: Secondary | ICD-10-CM

## 2024-10-04 DIAGNOSIS — J432 Centrilobular emphysema: Secondary | ICD-10-CM | POA: Insufficient documentation

## 2024-10-04 DIAGNOSIS — J849 Interstitial pulmonary disease, unspecified: Secondary | ICD-10-CM | POA: Insufficient documentation

## 2024-10-04 DIAGNOSIS — J8410 Pulmonary fibrosis, unspecified: Secondary | ICD-10-CM | POA: Insufficient documentation

## 2024-10-04 DIAGNOSIS — K8020 Calculus of gallbladder without cholecystitis without obstruction: Secondary | ICD-10-CM | POA: Insufficient documentation

## 2024-10-04 NOTE — Progress Notes (Signed)
"  °  Subjective:  Patient ID: Edward Frazier, male    DOB: 11/05/1945,  MRN: [id number]  Edward Frazier presents to clinic today for preulcerative lesion(s) R 2nd toe and painful mycotic toenails that limit ambulation. Painful toenails interfere with ambulation. Aggravating factors include wearing enclosed shoe gear. Pain is relieved with periodic professional debridement. Painful preulcerative lesion(s) is/are aggravated when weightbearing with and without shoegear. Pain is relieved with periodic professional debridement.  Chief Complaint  Patient presents with   Nail Problem    Thick painful toenails, 4 month follow up    Diabetes    A1C  5.6   New problem(s): None.   PCP is Loreli Elsie JONETTA Mickey., MD. ARNETTA 01/03/24.  Allergies[1]  Review of Systems: Negative except as noted in the HPI.  Objective: No changes noted in today's physical examination. There were no vitals filed for this visit. ABDISHAKUR Edward Frazier is a pleasant 79 y.o. male WD, WN in NAD. AAO x 3.  Vascular Examination: Vascular status intact b/l with palpable pedal pulses. Pedal hair sparse. CFT immediate b/l. No edema. No pain with calf compression b/l. Skin temperature gradient WNL b/l.   Neurological Examination: Sensation grossly intact b/l with 10 gram monofilament. Vibratory sensation intact b/l.   Dermatological Examination: Pedal skin with normal turgor, texture and tone b/l. Toenails 1-5 b/l thick, discolored, elongated with subungual debris and pain on dorsal palpation.    Preulcerative lesion noted distal tip of right 2nd toe. There is visible subdermal hemorrhage. There is no surrounding erythema, no edema, no drainage, no odor, no fluctuance.  Musculoskeletal Examination: Muscle strength 5/5 to LLE. Muscle wasting noted RLE; weakness noted RLE secondary to polio. Hammertoe deformity noted 2-5 b/l.  Radiographs: None  Assessment/Plan: 1. Pain due to onychomycosis of toenail   Consent given for treatment. Patient  examined. All patient's and/or POA's questions/concerns addressed on today's visit. Mycotic toenails 1-5 b/l debrided in length and girth without incident. As a courtesy, preulcerative lesion(s) distal tip of right 2nd toe pared with sharp debridement without incident.  Continue toe crest for right 2nd toe daily. Continue soft, supportive shoe gear daily. Report any pedal injuries to medical professional. Call office if there are any questions/concerns  Return in about 10 weeks (around 12/13/2024).  [name], DPM      [place] [place] LOCATION: [address] [place] [address]                   Office [phone]   [facility] LOCATION: [address] [place], [place] [address] Office [phone]      [1]  Allergies Allergen Reactions   Penicillin G Rash   "

## 2024-12-24 ENCOUNTER — Ambulatory Visit: Admitting: Podiatry
